# Patient Record
Sex: Female | Born: 1955 | Race: Asian | Hispanic: No | Marital: Married | Smoking: Never smoker
Health system: Southern US, Community
[De-identification: ages and names within clinical notes are randomized; demographics above are authoritative.]

## PROBLEM LIST (undated history)

## (undated) DIAGNOSIS — B1910 Unspecified viral hepatitis B without hepatic coma: Secondary | ICD-10-CM

## (undated) DIAGNOSIS — I10 Essential (primary) hypertension: Secondary | ICD-10-CM

---

## 2005-12-04 ENCOUNTER — Encounter: Admission: RE | Admit: 2005-12-04 | Discharge: 2005-12-04 | Payer: Self-pay | Admitting: General Practice

## 2006-02-24 ENCOUNTER — Ambulatory Visit: Payer: Self-pay | Admitting: Family Medicine

## 2006-03-04 ENCOUNTER — Ambulatory Visit: Payer: Self-pay | Admitting: Family Medicine

## 2006-04-28 ENCOUNTER — Ambulatory Visit (HOSPITAL_COMMUNITY): Admission: RE | Admit: 2006-04-28 | Discharge: 2006-04-28 | Payer: Self-pay | Admitting: Obstetrics

## 2006-09-11 ENCOUNTER — Emergency Department (HOSPITAL_COMMUNITY): Admission: EM | Admit: 2006-09-11 | Discharge: 2006-09-11 | Payer: Self-pay | Admitting: Family Medicine

## 2006-12-02 ENCOUNTER — Encounter: Admission: RE | Admit: 2006-12-02 | Discharge: 2006-12-02 | Payer: Self-pay | Admitting: General Practice

## 2008-07-27 ENCOUNTER — Emergency Department (HOSPITAL_COMMUNITY): Admission: EM | Admit: 2008-07-27 | Discharge: 2008-07-27 | Payer: Self-pay | Admitting: Emergency Medicine

## 2009-10-01 ENCOUNTER — Ambulatory Visit: Payer: Self-pay | Admitting: Internal Medicine

## 2009-10-01 ENCOUNTER — Encounter (INDEPENDENT_AMBULATORY_CARE_PROVIDER_SITE_OTHER): Payer: Self-pay | Admitting: Family Medicine

## 2009-10-01 LAB — CONVERTED CEMR LAB
ALT: 8 units/L (ref 0–35)
AST: 19 units/L (ref 0–37)
Alkaline Phosphatase: 97 units/L (ref 39–117)
Basophils Relative: 0 % (ref 0–1)
Calcium: 10.1 mg/dL (ref 8.4–10.5)
Chloride: 105 meq/L (ref 96–112)
Creatinine, Ser: 0.5 mg/dL (ref 0.40–1.20)
Lymphs Abs: 3 10*3/uL (ref 0.7–4.0)
Monocytes Relative: 8 % (ref 3–12)
Neutro Abs: 3.2 10*3/uL (ref 1.7–7.7)
Neutrophils Relative %: 46 % (ref 43–77)
RBC: 4.24 M/uL (ref 3.87–5.11)
Sed Rate: 35 mm/hr — ABNORMAL HIGH (ref 0–22)
WBC: 6.9 10*3/uL (ref 4.0–10.5)

## 2009-10-02 ENCOUNTER — Encounter (INDEPENDENT_AMBULATORY_CARE_PROVIDER_SITE_OTHER): Payer: Self-pay | Admitting: Family Medicine

## 2009-10-02 LAB — CONVERTED CEMR LAB: Hgb A1c MFr Bld: 6 % — ABNORMAL HIGH (ref <5.7)

## 2009-11-02 ENCOUNTER — Ambulatory Visit: Payer: Self-pay | Admitting: Internal Medicine

## 2009-11-02 ENCOUNTER — Encounter (INDEPENDENT_AMBULATORY_CARE_PROVIDER_SITE_OTHER): Payer: Self-pay | Admitting: Family Medicine

## 2009-11-02 LAB — CONVERTED CEMR LAB
CRP: 0 mg/dL (ref <0.6)
HCV Ab: NEGATIVE
Hep A Total Ab: POSITIVE — AB
Hep B Core Total Ab: POSITIVE — AB
Hep B S Ab: NEGATIVE

## 2009-11-03 ENCOUNTER — Encounter (INDEPENDENT_AMBULATORY_CARE_PROVIDER_SITE_OTHER): Payer: Self-pay | Admitting: Family Medicine

## 2009-11-08 ENCOUNTER — Encounter: Admission: RE | Admit: 2009-11-08 | Discharge: 2009-12-05 | Payer: Self-pay | Admitting: Family Medicine

## 2009-11-12 ENCOUNTER — Ambulatory Visit: Payer: Self-pay | Admitting: Family Medicine

## 2009-12-06 ENCOUNTER — Ambulatory Visit: Payer: Self-pay | Admitting: Internal Medicine

## 2009-12-06 ENCOUNTER — Ambulatory Visit (HOSPITAL_COMMUNITY): Admission: RE | Admit: 2009-12-06 | Discharge: 2009-12-06 | Payer: Self-pay | Admitting: Family Medicine

## 2010-01-25 ENCOUNTER — Encounter (INDEPENDENT_AMBULATORY_CARE_PROVIDER_SITE_OTHER): Payer: Self-pay | Admitting: *Deleted

## 2010-01-25 LAB — CONVERTED CEMR LAB
BUN: 11 mg/dL (ref 6–23)
Basophils Relative: 0 % (ref 0–1)
CO2: 26 meq/L (ref 19–32)
Calcium: 9.4 mg/dL (ref 8.4–10.5)
Eosinophils Absolute: 0.2 10*3/uL (ref 0.0–0.7)
Eosinophils Relative: 3 % (ref 0–5)
Glucose, Bld: 114 mg/dL — ABNORMAL HIGH (ref 70–99)
HCT: 38.8 % (ref 36.0–46.0)
Hemoglobin: 13.1 g/dL (ref 12.0–15.0)
Lymphs Abs: 2.7 10*3/uL (ref 0.7–4.0)
MCHC: 33.8 g/dL (ref 30.0–36.0)
MCV: 93.3 fL (ref 78.0–100.0)
Monocytes Absolute: 0.4 10*3/uL (ref 0.1–1.0)
Monocytes Relative: 7 % (ref 3–12)
RBC: 4.16 M/uL (ref 3.87–5.11)
TSH: 1.146 microintl units/mL (ref 0.350–4.500)

## 2010-01-31 ENCOUNTER — Ambulatory Visit (HOSPITAL_COMMUNITY): Admission: RE | Admit: 2010-01-31 | Discharge: 2010-01-31 | Payer: Self-pay | Admitting: Internal Medicine

## 2010-02-21 ENCOUNTER — Ambulatory Visit: Payer: Self-pay | Admitting: Gastroenterology

## 2010-04-25 ENCOUNTER — Ambulatory Visit: Admit: 2010-04-25 | Payer: Self-pay | Admitting: Gastroenterology

## 2010-05-04 ENCOUNTER — Encounter: Payer: Self-pay | Admitting: Internal Medicine

## 2010-05-04 ENCOUNTER — Encounter: Payer: Self-pay | Admitting: Gastroenterology

## 2010-07-24 LAB — DIFFERENTIAL
Lymphocytes Relative: 43 % (ref 12–46)
Lymphs Abs: 3.6 10*3/uL (ref 0.7–4.0)
Monocytes Relative: 7 % (ref 3–12)
Neutro Abs: 4.1 10*3/uL (ref 1.7–7.7)
Neutrophils Relative %: 49 % (ref 43–77)

## 2010-07-24 LAB — CBC
Hemoglobin: 13.3 g/dL (ref 12.0–15.0)
MCHC: 34.6 g/dL (ref 30.0–36.0)
MCV: 97 fL (ref 78.0–100.0)
RBC: 3.95 MIL/uL (ref 3.87–5.11)
RDW: 12.4 % (ref 11.5–15.5)

## 2010-07-24 LAB — COMPREHENSIVE METABOLIC PANEL
ALT: 14 U/L (ref 0–35)
CO2: 27 mEq/L (ref 19–32)
Calcium: 9.3 mg/dL (ref 8.4–10.5)
Creatinine, Ser: 0.5 mg/dL (ref 0.4–1.2)
GFR calc Af Amer: >60 mL/min (ref >60)
GFR calc non Af Amer: >60 mL/min (ref >60)
Glucose, Bld: 94 mg/dL (ref 70–99)
Sodium: 143 mEq/L (ref 135–145)
Total Protein: 7.5 g/dL (ref 6.0–8.3)

## 2010-07-24 LAB — URINALYSIS, ROUTINE W REFLEX MICROSCOPIC
Bilirubin Urine: NEGATIVE
Hgb urine dipstick: NEGATIVE
Ketones, ur: NEGATIVE mg/dL
Nitrite: NEGATIVE
Urobilinogen, UA: 1 mg/dL (ref 0.0–1.0)

## 2010-07-24 LAB — POCT CARDIAC MARKERS
CKMB, poc: <1 ng/mL — ABNORMAL LOW (ref 1.0–8.0)
Myoglobin, poc: 12.4 ng/mL (ref 12–200)
Troponin i, poc: <0.05 ng/mL (ref 0.00–0.09)
Troponin i, poc: <0.05 ng/mL (ref 0.00–0.09)

## 2010-07-24 LAB — LIPASE, BLOOD: Lipase: 37 U/L (ref 11–59)

## 2013-04-16 ENCOUNTER — Emergency Department (HOSPITAL_COMMUNITY)
Admission: EM | Admit: 2013-04-16 | Discharge: 2013-04-16 | Disposition: A | Discharge status: Home / Self Care | Payer: No Typology Code available for payment source | Attending: Emergency Medicine | Admitting: Emergency Medicine

## 2013-04-16 ENCOUNTER — Encounter (HOSPITAL_COMMUNITY): Payer: Self-pay | Admitting: Emergency Medicine

## 2013-04-16 DIAGNOSIS — R141 Gas pain: Secondary | ICD-10-CM | POA: Insufficient documentation

## 2013-04-16 DIAGNOSIS — I10 Essential (primary) hypertension: Secondary | ICD-10-CM | POA: Insufficient documentation

## 2013-04-16 DIAGNOSIS — R143 Flatulence: Secondary | ICD-10-CM

## 2013-04-16 DIAGNOSIS — M6281 Muscle weakness (generalized): Secondary | ICD-10-CM | POA: Insufficient documentation

## 2013-04-16 DIAGNOSIS — R5381 Other malaise: Secondary | ICD-10-CM | POA: Insufficient documentation

## 2013-04-16 DIAGNOSIS — R5383 Other fatigue: Secondary | ICD-10-CM

## 2013-04-16 DIAGNOSIS — R142 Eructation: Secondary | ICD-10-CM | POA: Insufficient documentation

## 2013-04-16 DIAGNOSIS — R197 Diarrhea, unspecified: Secondary | ICD-10-CM | POA: Insufficient documentation

## 2013-04-16 DIAGNOSIS — Z79899 Other long term (current) drug therapy: Secondary | ICD-10-CM | POA: Insufficient documentation

## 2013-04-16 DIAGNOSIS — R109 Unspecified abdominal pain: Secondary | ICD-10-CM | POA: Insufficient documentation

## 2013-04-16 DIAGNOSIS — K5900 Constipation, unspecified: Secondary | ICD-10-CM | POA: Insufficient documentation

## 2013-04-16 DIAGNOSIS — B1910 Unspecified viral hepatitis B without hepatic coma: Secondary | ICD-10-CM | POA: Insufficient documentation

## 2013-04-16 DIAGNOSIS — R630 Anorexia: Secondary | ICD-10-CM | POA: Insufficient documentation

## 2013-04-16 DIAGNOSIS — R634 Abnormal weight loss: Secondary | ICD-10-CM | POA: Insufficient documentation

## 2013-04-16 HISTORY — DX: Unspecified viral hepatitis B without hepatic coma: B19.10

## 2013-04-16 HISTORY — DX: Essential (primary) hypertension: I10

## 2013-04-16 LAB — CBC WITH DIFFERENTIAL/PLATELET
BASOS ABS: 0 10*3/uL (ref 0.0–0.1)
Basophils Relative: 0 % (ref 0–1)
EOS ABS: 0.1 10*3/uL (ref 0.0–0.7)
Eosinophils Relative: 1 % (ref 0–5)
HCT: 39.1 % (ref 36.0–46.0)
Hemoglobin: 13.4 g/dL (ref 12.0–15.0)
LYMPHS ABS: 2.6 10*3/uL (ref 0.7–4.0)
LYMPHS PCT: 37 % (ref 12–46)
MCH: 32.6 pg (ref 26.0–34.0)
MCHC: 34.3 g/dL (ref 30.0–36.0)
MCV: 95.1 fL (ref 78.0–100.0)
Monocytes Absolute: 0.6 10*3/uL (ref 0.1–1.0)
Monocytes Relative: 8 % (ref 3–12)
NEUTROS PCT: 55 % (ref 43–77)
Neutro Abs: 3.9 10*3/uL (ref 1.7–7.7)
PLATELETS: 221 10*3/uL (ref 150–400)
RBC: 4.11 MIL/uL (ref 3.87–5.11)
RDW: 12.6 % (ref 11.5–15.5)
WBC: 7.2 10*3/uL (ref 4.0–10.5)

## 2013-04-16 LAB — URINALYSIS, ROUTINE W REFLEX MICROSCOPIC
Bilirubin Urine: NEGATIVE
Glucose, UA: NEGATIVE mg/dL
Ketones, ur: NEGATIVE mg/dL
Leukocytes, UA: NEGATIVE
NITRITE: NEGATIVE
Protein, ur: NEGATIVE mg/dL
Specific Gravity, Urine: 1.01 (ref 1.005–1.030)
UROBILINOGEN UA: 0.2 mg/dL (ref 0.0–1.0)
pH: 7 (ref 5.0–8.0)

## 2013-04-16 LAB — COMPREHENSIVE METABOLIC PANEL
ALK PHOS: 103 U/L (ref 39–117)
AST: 11 U/L (ref 0–37)
Albumin: 4.6 g/dL (ref 3.5–5.2)
BUN: 14 mg/dL (ref 6–23)
CO2: 27 meq/L (ref 19–32)
Calcium: 9.5 mg/dL (ref 8.4–10.5)
Chloride: 102 mEq/L (ref 96–112)
Creatinine, Ser: 0.48 mg/dL — ABNORMAL LOW (ref 0.50–1.10)
GFR calc non Af Amer: >90 mL/min (ref >90)
GLUCOSE: 117 mg/dL — AB (ref 70–99)
POTASSIUM: 4.1 meq/L (ref 3.7–5.3)
SODIUM: 141 meq/L (ref 137–147)
TOTAL PROTEIN: 8.4 g/dL — AB (ref 6.0–8.3)
Total Bilirubin: 0.5 mg/dL (ref 0.3–1.2)

## 2013-04-16 LAB — LIPASE, BLOOD: LIPASE: 66 U/L — AB (ref 11–59)

## 2013-04-16 LAB — URINE MICROSCOPIC-ADD ON

## 2013-04-16 MED ORDER — DICYCLOMINE HCL 20 MG PO TABS: 20.0000 mg | ORAL_TABLET | Freq: Four times a day (QID) | ORAL | Status: DC | PRN

## 2013-04-16 MED ORDER — FAMOTIDINE 20 MG PO TABS: 20.0000 mg | ORAL_TABLET | Freq: Two times a day (BID) | ORAL | Status: DC

## 2013-04-16 NOTE — ED Notes (Signed)
Pt.'s daughter reported elevated blood pressure at home this evening 160/103 with fatigue and slight headache . Pt. is not taking medications for hypertension .

## 2013-04-16 NOTE — Discharge Instructions (Signed)
Your workup today has not shown a specific cause for your symptoms.  Please take medications as prescribed.  Check your blood pressures twice a day and write the numbers down in a log.  Bring this with ou to your first appointment.  Follow up with the referrals listed above for further workup.  Return to the ER for worsening condition or new concerning symptoms.   Abdominal Pain Abdominal pain can be caused by many things. Your caregiver decides the seriousness of your pain by an examination and possibly blood tests and X-rays. Many cases can be observed and treated at home. Most abdominal pain is not caused by a disease and will probably improve without treatment. However, in many cases, more time must pass before a clear cause of the pain can be found. Before that point, it may not be known if you need more testing, or if hospitalization or surgery is needed. HOME CARE INSTRUCTIONS   Do not take laxatives unless directed by your caregiver.  Take pain medicine only as directed by your caregiver.  Only take over-the-counter or prescription medicines for pain, discomfort, or fever as directed by your caregiver.  Try a clear liquid diet (broth, tea, or water) for as long as directed by your caregiver. Slowly move to a bland diet as tolerated. SEEK IMMEDIATE MEDICAL CARE IF:   The pain does not go away.  You have a fever.  You keep throwing up (vomiting).  The pain is felt only in portions of the abdomen. Pain in the right side could possibly be appendicitis. In an adult, pain in the left lower portion of the abdomen could be colitis or diverticulitis.  You pass bloody or black tarry stools. MAKE SURE YOU:   Understand these instructions.  Will watch your condition.  Will get help right away if you are not doing well or get worse. Document Released: 01/08/2005 Document Revised: 06/23/2011 Document Reviewed: 11/17/2007 [facility] Patient Information 2014 [place].  How to Take  Your Blood Pressure  These instructions are only for electronic home blood pressure machines. You will need:   An automatic or semi-automatic blood pressure machine.  Fresh batteries for the blood pressure machine. HOW DO I USE THESE TOOLS TO CHECK MY BLOOD PRESSURE?   There are 2 numbers that make up your blood pressure. For example: 120/80.  The first number (120 in our example) is called the "systolic pressure." It is a measure of the pressure in your blood vessels when your heart is pumping blood.  The second number (80 in our example) is called the "diastolic pressure." It is a measure of the pressure in your blood vessels when your heart is resting between beats.  Before you buy a home blood pressure machine, check the size of your arm so you can buy the right size cuff. Here is how to check the size of your arm:  Use a tape measure that shows both inches and centimeters.  Wrap the tape measure around the middle upper part of your arm. You may need someone to help you measure right.  Write down your arm measurement in both inches and centimeters.  To measure your blood pressure right, it is important to have the right size cuff.  If your arm is up to 13 inches (37 to 34 centimeters), get an adult cuff size.  If your arm is 13 to 17 inches (35 to 44 centimeters), get a large adult cuff size.  If your arm is 17 to 20  inches (45 to 52 centimeters), get an adult thigh cuff.  Try to rest or relax for at least 30 minutes before you check your blood pressure.  Do not smoke.  Do not have any drinks with caffeine, such as:  Pop.  Coffee.  Tea.  Check your blood pressure in a quiet room.  Sit down and stretch out your arm on a table. Keep your arm at about the level of your heart. Let your arm relax. GETTING BLOOD PRESSURE READINGS  Make sure you remove any tight-fighting clothing from your arm. Wrap the cuff around your upper arm. Wrap it just above the bend, and above  where you felt the pulse. You should be able to slip a finger between the cuff and your arm. If you cannot slip a finger in the cuff, it is too tight and should be removed and rewrapped.  Some units requires you to manually pump up the arm cuff.  Automatic units inflate the cuff when you press a button.  Cuff deflation is automatic in both models.  After the cuff is inflated, the unit measures your blood pressure and pulse. The readings are displayed on a monitor. Hold still and breathe normally while the cuff is inflated.  Getting a reading takes less than a minute.  Some models store readings in a memory. Some provide a printout of readings.  Get readings at different times of the day. You should wait at least 5 minutes between readings. Take readings with you to your next doctor's visit. Document Released: 03/13/2008 Document Revised: 06/23/2011 Document Reviewed: 03/13/2008 [facility] Patient Information 2014 [place].

## 2013-04-16 NOTE — ED Provider Notes (Signed)
CSN: [id number]     Arrival date & time 04/16/13  0130 History   First MD Initiated Contact with Patient 04/16/13 0315     Chief Complaint  Patient presents with  . Hypertension   (Consider location/radiation/quality/duration/timing/severity/associated sxs/prior Treatment) HPI 58 year old female presents to emergency department with her daughter with complaints of 2 weeks of abdominal pain, generalized weakness, and fatigue, loss of weight, and abdominal bloating.  Patient has had ongoing stomach issues for many years with bloating and pain.  Over the last 2 weeks.  Pain has been more constant.  It is worse after eating.  It is described as an aching, cramping, uncomfortable feeling.  Patient reports a 14 pound weight loss over the last 3 months.  Patient has lost her appetite.  She is concerned about possible stomach cancer as her father died from it.  A few years ago.  She has no primary care Dr.  Regis Bill notes that her blood pressure is elevated.  When she is having episodes of pain.  No prior history of hypertension.  Patient denies any change in the color or caliber of her stools.  She has intermittent constipation and diarrhea at times.  No fevers or chills.  She will occasionally have headaches.  Past Medical History  Diagnosis Date  . Hypertension   . Hepatitis B    History reviewed. No pertinent past surgical history. No family history on file. History  Substance Use Topics  . Smoking status: Never Smoker   . Smokeless tobacco: Not on file  . Alcohol Use: No   OB History   Grav Para Term Preterm Abortions TAB SAB Ect Mult Living                 Review of Systems  All other systems reviewed and are negative.    Allergies  Review of patient's allergies indicates no known allergies.  Home Medications   Current Outpatient Rx  Name  Route  Sig  Dispense  Refill  . acetaminophen (TYLENOL) 325 MG tablet   Oral   Take 650 mg by mouth every 6 (six) hours as needed for mild  pain.         Marland Kitchen dicyclomine (BENTYL) 20 MG tablet   Oral   Take 1 tablet (20 mg total) by mouth every 6 (six) hours as needed for spasms (for abdominal cramping).   20 tablet   0   . famotidine (PEPCID) 20 MG tablet   Oral   Take 1 tablet (20 mg total) by mouth 2 (two) times daily.   30 tablet   0    BP 137/75  Pulse 78  Temp(Src) 98.2 F (36.8 C) (Oral)  Resp 9  Wt 129 lb 2 oz (58.571 kg)  SpO2 100% Physical Exam  Nursing note and vitals reviewed. Constitutional: She is oriented to person, place, and time. She appears well-developed and well-nourished. No distress.  HENT:  Head: Normocephalic and atraumatic.  Right Ear: External ear normal.  Left Ear: External ear normal.  Nose: Nose normal.  Mouth/Throat: Oropharynx is clear and moist.  Eyes: Conjunctivae and EOM are normal. Pupils are equal, round, and reactive to light.  Neck: Normal range of motion. Neck supple. No JVD present. No tracheal deviation present. No thyromegaly present.  Cardiovascular: Normal rate, regular rhythm, normal heart sounds and intact distal pulses.  Exam reveals no gallop and no friction rub.   No murmur heard. Pulmonary/Chest: Effort normal and breath sounds normal. No stridor. No respiratory  distress. She has no wheezes. She has no rales. She exhibits no tenderness.  Abdominal: Soft. Bowel sounds are normal. She exhibits no distension and no mass. There is no tenderness. There is no rebound and no guarding.  Musculoskeletal: Normal range of motion. She exhibits no edema and no tenderness.  Lymphadenopathy:    She has no cervical adenopathy.  Neurological: She is alert and oriented to person, place, and time. She has normal reflexes. No cranial nerve deficit. She exhibits normal muscle tone. Coordination normal.  Skin: Skin is warm and dry. No rash noted. No erythema. No pallor.  Psychiatric: She has a normal mood and affect. Her behavior is normal. Judgment and thought content normal.     ED Course  Procedures (including critical care time) Labs Review Labs Reviewed  COMPREHENSIVE METABOLIC PANEL - Abnormal; Notable for the following:    Glucose, Bld 117 (*)    Creatinine, Ser 0.48 (*)    Total Protein 8.4 (*)    All other components within normal limits  LIPASE, BLOOD - Abnormal; Notable for the following:    Lipase 66 (*)    All other components within normal limits  URINALYSIS, ROUTINE W REFLEX MICROSCOPIC - Abnormal; Notable for the following:    Hgb urine dipstick SMALL (*)    All other components within normal limits  CBC WITH DIFFERENTIAL  URINE MICROSCOPIC-ADD ON   Imaging Review No results found.  EKG Interpretation    Date/Time:  Saturday April 16 2013 03:19:25 EST Ventricular Rate:  74 PR Interval:  157 QRS Duration: 90 QT Interval:  411 QTC Calculation: 456 R Axis:   44 Text Interpretation:  Sinus rhythm Baseline wander in lead(s) V6 No old tracing to compare Confirmed by Kalei Meda  MD, Laurrie Toppin ([id number]) on 04/16/2013 4:08:10 AM            MDM   1. Abdominal pain   2. Hypertension    58 year old female with ongoing abdominal pain, generalized weakness, and reported weight loss.  Her exam today is benign.  Labwork unremarkable.  She does have slightly elevated blood pressure.  Plan is to give trial of Bentyl and Pepcid, referral to GI, and local primary care Dr.    Kalman Drape, MD 04/16/13 [phone]

## 2013-04-27 ENCOUNTER — Encounter: Payer: Self-pay | Admitting: Gastroenterology

## 2013-05-09 ENCOUNTER — Ambulatory Visit: Payer: Self-pay | Admitting: Family Medicine

## 2013-05-27 ENCOUNTER — Ambulatory Visit: Payer: Self-pay | Admitting: Emergency Medicine

## 2013-05-27 VITALS — BP 142/92 | HR 81 | Temp 98.9°F | Resp 16 | Ht 61.5 in | Wt 123.6 lb

## 2013-05-27 DIAGNOSIS — F3289 Other specified depressive episodes: Secondary | ICD-10-CM

## 2013-05-27 DIAGNOSIS — R5383 Other fatigue: Secondary | ICD-10-CM

## 2013-05-27 DIAGNOSIS — R05 Cough: Secondary | ICD-10-CM

## 2013-05-27 DIAGNOSIS — M419 Scoliosis, unspecified: Secondary | ICD-10-CM

## 2013-05-27 DIAGNOSIS — F32A Depression, unspecified: Secondary | ICD-10-CM

## 2013-05-27 DIAGNOSIS — F329 Major depressive disorder, single episode, unspecified: Secondary | ICD-10-CM

## 2013-05-27 DIAGNOSIS — M4120 Other idiopathic scoliosis, site unspecified: Secondary | ICD-10-CM

## 2013-05-27 DIAGNOSIS — R059 Cough, unspecified: Secondary | ICD-10-CM

## 2013-05-27 DIAGNOSIS — R634 Abnormal weight loss: Secondary | ICD-10-CM

## 2013-05-27 DIAGNOSIS — R5381 Other malaise: Secondary | ICD-10-CM

## 2013-05-27 DIAGNOSIS — R109 Unspecified abdominal pain: Secondary | ICD-10-CM

## 2013-05-27 LAB — POCT GLYCOSYLATED HEMOGLOBIN (HGB A1C): Hemoglobin A1C: 5.5

## 2013-05-27 MED ORDER — FLUOXETINE HCL 20 MG PO TABS: 20.0000 mg | ORAL_TABLET | Freq: Every day | ORAL | Status: DC

## 2013-05-27 MED ORDER — MELOXICAM 15 MG PO TABS: 15.0000 mg | ORAL_TABLET | Freq: Every day | ORAL | Status: DC

## 2013-05-27 NOTE — Progress Notes (Addendum)
[facility] [address], [place] [address] [phone]  Date:  05/27/2013   Name:  Lynn Fleming   DOB:  1955-04-27   MRN:  [id number]  PCP:  No PCP Per Patient    Chief Complaint: Back Pain, Abdominal Pain, Fatigue and Generalized Body Aches   History of Present Illness:  Lynn Fleming is a 58 y.o. very pleasant female patient who presents with the following:  Multiple issues.  Was seen in ER with abdominal pain for a couple months associated with fatigue and weight loss. There was an expressed concern by her daughter she was concerned about having stomach cancer as there is a family history.  She was referred to a GI doc but failed to attend.  Her labs at that time were essentially normal.  Her companion that brought her today feels that she has a "mental problem."  She is complaining of extremity and back pain with no history of injury or overuse. Says back pain present for 8 years at least and nothing ever done for it.   No nausea or vomiting.  No melena.  The patient has no complaint of blood, mucous, or pus in her stools. Appetite is normal and is sleeping normally.  No GU or GYN symptoms.   Not working.  No insurance.  Fearful that she must stay home alone all day and is worried about her life, finances and husband that is not working full time.  She has five adult children, two of whom are in [place]  She describes her life as happy while visiting [place] but sorrowful in the [place].  Has been out of work 6 months.  Patient had a job but quit after one day due numbness and weakness in hands and back pain.   There are no active problems to display for this patient.   Past Medical History  Diagnosis Date  . Hypertension   . Hepatitis B     History reviewed. No pertinent past surgical history.  History  Substance Use Topics  . Smoking status: Never Smoker   . Smokeless tobacco: Not on file  . Alcohol Use: No    No family history on  file.  No Known Allergies  Medication list has been reviewed and updated.  Current Outpatient Prescriptions on File Prior to Visit  Medication Sig Dispense Refill  . acetaminophen (TYLENOL) 325 MG tablet Take 650 mg by mouth every 6 (six) hours as needed for mild pain.      Marland Kitchen dicyclomine (BENTYL) 20 MG tablet Take 1 tablet (20 mg total) by mouth every 6 (six) hours as needed for spasms (for abdominal cramping).  20 tablet  0  . famotidine (PEPCID) 20 MG tablet Take 1 tablet (20 mg total) by mouth 2 (two) times daily.  30 tablet  0   No current facility-administered medications on file prior to visit.    Review of Systems:  As per HPI, otherwise negative.    Physical Examination: Filed Vitals:   05/27/13 1630  BP: 142/92  Pulse: 81  Temp: 98.9 F (37.2 C)  Resp: 16   Filed Vitals:   05/27/13 1630  Height: 5' 1.5" (1.562 m)  Weight: 123 lb 9.6 oz (56.065 kg)   Body mass index is 22.98 kg/(m^2). Ideal Body Weight: Weight in (lb) to have BMI = 25: 134.2  GEN: WDWN, NAD, Non-toxic, A & O x 3 HEENT: Atraumatic, Normocephalic. Neck supple. No masses,  No LAD. Ears and Nose: No external deformity. CV: RRR, No M/G/R. No JVD. No thrill. No extra heart sounds. PULM: CTA B, no wheezes, crackles, rhonchi. No retractions. No resp. distress. No accessory muscle use. ABD: S, NT, ND, +BS. No rebound. No HSM. EXTR: No c/c/e NEURO Normal gait.  PSYCH: Normally interactive. Conversant. Not anxious appearing.  Calm demeanor but tearful.  Back:  Marked scoliosis   Assessment and Plan: Scoliosis Back pain Abdominal pain with weight loss by history Depression   Signed,  Ellison Carwin, MD   Results for orders placed in visit on 05/27/13  POCT GLYCOSYLATED HEMOGLOBIN (HGB A1C)      Result Value Ref Range   Hemoglobin A1C 5.5

## 2013-05-27 NOTE — Patient Instructions (Signed)
Scoliosis  Scoliosis is the name given to a spine that curves sideways.Scoliosis can cause twisting of your shoulders, hips, chest, back, and rib cage.   CAUSES   The cause of scoliosis is not always known. It may be caused by a birth defect or by a disease that can cause muscular dysfunction and imbalance, such as cerebral palsy and muscular dystrophy.   RISK FACTORS  Having a disease that causes muscle disease or dysfunction.  SIGNS AND SYMPTOMS  Scoliosis often has no signs or symptoms.If they are present, they may include:   Unequal size of one body side compared to the other (asymmetry).   Visible curvature of the spine.   Pain. The pain may limit physical activity.   Shortness of breath.   Bowel or bladder issues.  DIAGNOSIS  A skilled health care provider will perform an evaluation. This will involve:   Taking your history.   Performing a physical examination.   Performing neurological exam to detect nerve or muscle function loss.   Range of motion studies on the spine.   X-rays.  An MRI may also be obtained.  TREATMENT   Treatment varies depending on the nature, extent, and severity of the disease. If the curvature is not great, you may need only observation. A brace may be used to prevent scoliosis from progressing. A brace may also be needed during growth spurts. Physical therapy may be of benefit. Surgery may be required.   HOME CARE INSTRUCTIONS    Your health care provider may suggest exercises to strengthen your muscles. Perform them as directed.   Ask your health care provider before participating in any sports.    If you have been prescribed an orthopedic brace, wear it as instructed by your health care provider.  SEEK MEDICAL CARE IF:  Your brace causes the skin to become sore (chafe) or is uncomfortable.   SEEK IMMEDIATE MEDICAL CARE IF:   You have back pain that is not relieved by the medicines prescribed by your health care provider.    Your legs feel weak or you lose function  in your legs.   You lose some bowel or bladder control.   Document Released: 03/28/2000 Document Revised: 01/19/2013 Document Reviewed: 12/06/2012  [employer] Patient Information 2014 [employer].

## 2013-05-31 ENCOUNTER — Ambulatory Visit: Payer: Self-pay | Admitting: Emergency Medicine

## 2013-05-31 VITALS — BP 158/92 | HR 85 | Temp 98.8°F | Resp 16 | Ht 61.5 in | Wt 122.0 lb

## 2013-05-31 DIAGNOSIS — F329 Major depressive disorder, single episode, unspecified: Secondary | ICD-10-CM

## 2013-05-31 DIAGNOSIS — M4120 Other idiopathic scoliosis, site unspecified: Secondary | ICD-10-CM

## 2013-05-31 DIAGNOSIS — F3289 Other specified depressive episodes: Secondary | ICD-10-CM

## 2013-05-31 DIAGNOSIS — K2990 Gastroduodenitis, unspecified, without bleeding: Secondary | ICD-10-CM

## 2013-05-31 DIAGNOSIS — F32A Depression, unspecified: Secondary | ICD-10-CM

## 2013-05-31 DIAGNOSIS — R079 Chest pain, unspecified: Secondary | ICD-10-CM

## 2013-05-31 DIAGNOSIS — K2970 Gastritis, unspecified, without bleeding: Secondary | ICD-10-CM

## 2013-05-31 DIAGNOSIS — M419 Scoliosis, unspecified: Secondary | ICD-10-CM

## 2013-05-31 NOTE — Progress Notes (Signed)
[facility] [address] [place] [address] [phone]  Date:  05/31/2013   Name:  Lynn Fleming   DOB:  [birth year]   MRN:  [id number]  PCP:  No PCP Per Patient    Chief Complaint: Chest Pain   History of Present Illness:  Lynn Fleming is a 58 y.o. very pleasant female patient who presents with the following:  Patient was seen Friday and put on paxil.  Says it isn't working and has stopped it.  She was put on pepcid and has stopped it because it made her worse.  She was previously scheduled for an EGD by Dr Benson Norway and did not complete it as she could not fast for the test.  She is complaining of pain in her abdomen that has been present for years.    There are no active problems to display for this patient.   Past Medical History  Diagnosis Date  . Hypertension   . Hepatitis B     History reviewed. No pertinent past surgical history.  History  Substance Use Topics  . Smoking status: Never Smoker   . Smokeless tobacco: Not on file  . Alcohol Use: No    History reviewed. No pertinent family history.  No Known Allergies  Medication list has been reviewed and updated.  Current Outpatient Prescriptions on File Prior to Visit  Medication Sig Dispense Refill  . acetaminophen (TYLENOL) 325 MG tablet Take 650 mg by mouth every 6 (six) hours as needed for mild pain.      . famotidine (PEPCID) 20 MG tablet Take 1 tablet (20 mg total) by mouth 2 (two) times daily.  30 tablet  0  . dicyclomine (BENTYL) 20 MG tablet Take 1 tablet (20 mg total) by mouth every 6 (six) hours as needed for spasms (for abdominal cramping).  20 tablet  0  . FLUoxetine (PROZAC) 20 MG tablet Take 1 tablet (20 mg total) by mouth daily.  30 tablet  3  . meloxicam (MOBIC) 15 MG tablet Take 1 tablet (15 mg total) by mouth daily.  30 tablet  2   No current facility-administered medications on file prior to visit.    Review of Systems:  As per HPI, otherwise negative.     Physical Examination: Filed Vitals:   05/31/13 1733  BP: 158/92  Pulse: 85  Temp: 98.8 F (37.1 C)  Resp: 16   Filed Vitals:   05/31/13 1733  Height: 5' 1.5" (1.562 m)  Weight: 122 lb (55.339 kg)   Body mass index is 22.68 kg/(m^2). Ideal Body Weight: Weight in (lb) to have BMI = 25: 134.2  GEN: WDWN, NAD, Non-toxic, A & O x 3 HEENT: Atraumatic, Normocephalic. Neck supple. No masses, No LAD. Ears and Nose: No external deformity. CV: RRR, No M/G/R. No JVD. No thrill. No extra heart sounds. PULM: CTA B, no wheezes, crackles, rhonchi. No retractions. No resp. distress. No accessory muscle use. ABD: S, NT, ND, +BS. No rebound. No HSM. EXTR: No c/c/e NEURO Normal gait.  PSYCH: Normally interactive. Conversant. Not depressed or anxious appearing.  Calm demeanor.    Assessment and Plan: Epigastric pain Follow up with Dr Benson Norway and ortho for scoliosis Non compliant. Instructed to take medication as instructed and follow up with consultants.   Signed,  Ellison Carwin, MD

## 2013-06-01 ENCOUNTER — Telehealth: Payer: Self-pay

## 2013-06-01 NOTE — Telephone Encounter (Signed)
Dr. Ouida Sills patient states you were suppose to send Rx to her pharmacy.  Please advise

## 2013-06-01 NOTE — Telephone Encounter (Signed)
She is incorrect. She needs to take the meds she has

## 2013-06-02 ENCOUNTER — Telehealth: Payer: Self-pay | Admitting: General Practice

## 2013-06-02 LAB — H. PYLORI ANTIBODY, IGG: H Pylori IgG: 0.45 {ISR}

## 2013-06-02 NOTE — Telephone Encounter (Signed)
Called and left vm for patient to give [place] a call back to schedule an appointment to see Dr Marin Comment

## 2013-06-03 ENCOUNTER — Encounter: Payer: Self-pay | Admitting: Emergency Medicine

## 2013-06-07 ENCOUNTER — Ambulatory Visit: Payer: Self-pay | Admitting: Family Medicine

## 2013-06-07 ENCOUNTER — Telehealth: Payer: Self-pay

## 2013-06-07 NOTE — Telephone Encounter (Signed)
PT HAD LAB WORK DONE AND THE DR WANTED HER TO COME IN TODAY AT 2:30, THEY WOULD LIKE TO KNOW WHY, HAD LAB WORK DONE AND WANT THE RESULTS. [place] [phone]

## 2013-06-08 NOTE — Telephone Encounter (Signed)
Pt currently not excepting phone calls. Will try again later.

## 2013-06-12 NOTE — Telephone Encounter (Signed)
Pt notified of results

## 2013-06-17 NOTE — Telephone Encounter (Signed)
Please call patient, and check status

## 2013-06-17 NOTE — Telephone Encounter (Signed)
LM with a female for pt to [facility]

## 2013-06-27 ENCOUNTER — Emergency Department (HOSPITAL_COMMUNITY): Payer: No Typology Code available for payment source

## 2013-06-27 ENCOUNTER — Encounter (HOSPITAL_COMMUNITY): Payer: Self-pay | Admitting: Emergency Medicine

## 2013-06-27 DIAGNOSIS — R072 Precordial pain: Secondary | ICD-10-CM | POA: Insufficient documentation

## 2013-06-27 DIAGNOSIS — Z79899 Other long term (current) drug therapy: Secondary | ICD-10-CM | POA: Insufficient documentation

## 2013-06-27 DIAGNOSIS — I10 Essential (primary) hypertension: Secondary | ICD-10-CM | POA: Insufficient documentation

## 2013-06-27 DIAGNOSIS — Z8619 Personal history of other infectious and parasitic diseases: Secondary | ICD-10-CM | POA: Insufficient documentation

## 2013-06-27 DIAGNOSIS — Z791 Long term (current) use of non-steroidal anti-inflammatories (NSAID): Secondary | ICD-10-CM | POA: Insufficient documentation

## 2013-06-27 LAB — BASIC METABOLIC PANEL
BUN: 11 mg/dL (ref 6–23)
CALCIUM: 9.1 mg/dL (ref 8.4–10.5)
CO2: 22 mEq/L (ref 19–32)
Chloride: 101 mEq/L (ref 96–112)
Creatinine, Ser: 0.43 mg/dL — ABNORMAL LOW (ref 0.50–1.10)
GFR calc Af Amer: >90 mL/min (ref >90)
GFR calc non Af Amer: >90 mL/min (ref >90)
Glucose, Bld: 135 mg/dL — ABNORMAL HIGH (ref 70–99)
Potassium: 3.4 mEq/L — ABNORMAL LOW (ref 3.7–5.3)
Sodium: 139 mEq/L (ref 137–147)

## 2013-06-27 LAB — CBC
HEMATOCRIT: 37.4 % (ref 36.0–46.0)
Hemoglobin: 13 g/dL (ref 12.0–15.0)
MCH: 32.7 pg (ref 26.0–34.0)
MCHC: 34.8 g/dL (ref 30.0–36.0)
MCV: 94.2 fL (ref 78.0–100.0)
Platelets: 248 10*3/uL (ref 150–400)
RBC: 3.97 MIL/uL (ref 3.87–5.11)
RDW: 13 % (ref 11.5–15.5)
WBC: 6.8 10*3/uL (ref 4.0–10.5)

## 2013-06-27 LAB — I-STAT TROPONIN, ED: Troponin i, poc: 0.01 ng/mL (ref 0.00–0.08)

## 2013-06-27 NOTE — ED Notes (Signed)
Patient with history of HTN, comes in with chest pain and shortness of breath that started approx 30 minutes ago.   Patient is diaphoretic upon arrival.  Patient is CAOx3.  Patient describes that pain as a pressure.

## 2013-06-28 ENCOUNTER — Emergency Department (HOSPITAL_COMMUNITY)
Admission: EM | Admit: 2013-06-28 | Discharge: 2013-06-28 | Disposition: A | Discharge status: Home / Self Care | Payer: No Typology Code available for payment source | Attending: Emergency Medicine | Admitting: Emergency Medicine

## 2013-06-28 DIAGNOSIS — R079 Chest pain, unspecified: Secondary | ICD-10-CM

## 2013-06-28 DIAGNOSIS — I10 Essential (primary) hypertension: Secondary | ICD-10-CM

## 2013-06-28 NOTE — Discharge Instructions (Signed)
Keep a record of your blood pressures and follow up these up with your primary doctor in the next week.  Return to the ER if your symptoms worsen or you develop other new or concerning symptoms.   T?ng Huy?t p (Hypertension) Khi tim ??p, n ??y mu qua cc ??ng m?ch. L?c ??y ny ???c g?i l huy?t p. N?u huy?t p qu cao, ng??i ta g?i ? l ch?ng t?ng huy?t p (HTN) hay cao huy?t p. Ch?ng t?ng huy?t p r?t nguy hi?m v b?n c th? m?c ph?i m khng hay bi?t g. Huy?t p cao c ngh?a l tim c?a b?n ph?i lm vi?c nhi?u h?n ?? b?m mu. Cc ??ng m?ch c th? b? h?p ho?c x? c?ng. T?ng cng cho tim lm b?n c nguy c? m?c b?nh tim, ??t qu?, v cc v?n ?? khc.  Huy?t p bao g?m hai con s?, s? cao h?n trn s? th?p h?n, v d?: 110/72. Huy?t p ???c ghi l "110 trn 72". L t??ng l d??i 120 cho s? trn (tm thu) v d??i 80 cho s? d??i (tm tr??ng). Ghi huy?t p c?a b?n ngy hm nay. B?n nn h?t s?c ch  ??n huy?t p c?a mnh n?u ?ang m?c ph?i nh?ng c?n b?nh nh?t ??ng nh? l:  Suy tim  Tr??c ?y b? nh?i mu c? tim  Ti?u ???ng  B?nh th?n m?n tnh  Tr??c ?y b? ??t qu?  Nhi?u nguy c? m?c b?nh tim. ?? xem c b? m?c ch?ng t?ng huy?t p hay khng, b?n nn ?o huy?t p khi ?ang ng?i v?i cnh tay ???c ??t ngang t?m tim c?a b?n. Nn ?o huy?t p t nh?t l hai l?n. ??c s? huy?t p cao m?t l?n (??c bi?t l ? [place] C?p C?u) khng c ngh?a l b?n c?n ph?i ???c ?i?u tr?. C th? c cc c?n b?nh c huy?t p khc nhau gi?a tay tri v tay ph?i. ?i?u quan tr?ng l ph?i ?i khm s?m ?? chuyn gia ch?m [place] s?c kh?e ki?m tra l?i. ?a s? m?i ng??i m?c ph?i ch?ng t?ng huy?t p nguyn pht, ?i?u ny c ngh?a l khng c nguyn nhn c? th?. C th? lm gi?m d?ng cao huy?t p ny b?ng cch thay ??i cc y?u t? v? l?i s?ng nh? l:  C?ng th?ng  Ht thu?c  Khng t?p th? d?c  Th?a cn  S? d?ng ma ty/thu?c l/r??u  Ch? ?? ?n t mu?i ?a s? ng??i ta khng c cc tri?u ch?ng do cao huy?t p cho ??n khi b?nh gy t?n h?i cho c?  th?. Vi?c ?i?u tr? hi?u qu? th??ng c th? ng?n ch?n, tr hon hay gi?m m?c ?? t?n h?i ?. ?I?U TR? Khi ? xc ??nh ???c nguyn nhn, ?i?u tr? t?ng huy?t p s? nh?m vo nguyn nhn. C r?t nhi?u thu?c ?i?u tr? ch?ng t?ng huy?t p. Cc thu?c ny c th? ???c chia ra thnh vi lo?i, v chuyn gia ch?m [place] [employer] s?c kh?e s? gip b?n ch?n cc lo?i thu?c t?t nh?t cho b?n. Thu?c c th? c cc tc d?ng ph?. B?n v chuyn gia ch?m [place] s?c kh?e nn xem l?i nh?ng tc d?ng ph? ?. N?u huy?t p v?n cao sau khi b?n ? thay ??i l?i s?ng ho?c b?t ??u dng thu?c th,  C th? c?n ph?i thay ??i (cc) thu?c.  C th? c?n ph?i gi?i quy?t ??n nh?ng v?n ?? khc.  Ph?i ch?c ch?n r?ng b?n hi?u toa thu?c, v bi?t r khi no dng thu?c v dng  thu?c nh? th? no.  Ph?i ch?c ch?n r?ng b?n g?p chuyn gia ch?m [place] s?c kh?e ?? ti khm trong khung th?i gian ? ???c khuy?n co (th??ng l trong vng hai tu?n) ?? ki?m tra l?i huy?t p v xem l?i thu?c.  N?u dng nhi?u lo?i thu?c ?? h? huy?t p, ph?i ch?c ch?n r?ng b?n bi?t khi no nn dng thu?c v dng nh? th? no. Dng cng m?t lc hai lo?i thu?c c th? d?n ??n huy?t p h? qu th?p. ?I KHM NGAY L?P T?C N?U:  B?n b? nh?c ??u d? d?i, th? l?c thay ??i hay b? m?, ho?c l l?n.  B?n b? y?u ho?c t b?t th??ng, ho?c c c?m gic ng?t.  ?au b?ng hay ng?c tr?m tr?ng, i m?a, ho?c kh th?. HY CH?C CH?N R?NG B?N:  Hi?u nh?ng ch? d?n ny.  S? theo di tnh tr?ng c?a b?n.  S? nh?n ???c s? gip ?? ngay l?p t?c n?u b?n khng ?? ho?c tnh tr?ng tr?m tr?ng h?n. Document Released: 03/31/2005 Document Revised: 12/01/2012 [facility] Patient Information 2014 [place], [place].

## 2013-06-29 NOTE — ED Provider Notes (Signed)
CSN: [id number]     Arrival date & time 06/27/13  2240 History   First MD Initiated Contact with Patient 06/28/13 0335     Chief Complaint  Patient presents with  . Chest Pain     (Consider location/radiation/quality/duration/timing/severity/associated sxs/prior Treatment) HPI Comments: Patient is a 58 year old female with history of hypertension. She presents with chest discomfort that started approximately 30 minutes prior to arrival. She denies any recent exertional symptoms and has no cardiac risk factors with the exception of hypertension.  Patient does not speak [name], only [place]. History was taken using the daughter as Optometrist. She was present at bedside.  Patient is a 58 y.o. female presenting with chest pain. The history is provided by the patient.  Chest Pain Pain location:  Substernal area Pain quality: pressure   Pain radiates to:  Does not radiate Pain radiates to the back: no   Pain severity:  Moderate Onset quality:  Gradual Duration:  1 hour Timing:  Constant Progression:  Resolved Relieved by:  Nothing Worsened by:  Nothing tried   Past Medical History  Diagnosis Date  . Hypertension   . Hepatitis B    History reviewed. No pertinent past surgical history. No family history on file. History  Substance Use Topics  . Smoking status: Never Smoker   . Smokeless tobacco: Not on file  . Alcohol Use: No   OB History   Grav Para Term Preterm Abortions TAB SAB Ect Mult Living                 Review of Systems  Cardiovascular: Positive for chest pain.  All other systems reviewed and are negative.      Allergies  Review of patient's allergies indicates no known allergies.  Home Medications   Current Outpatient Rx  Name  Route  Sig  Dispense  Refill  . hydrOXYzine (ATARAX/VISTARIL) 50 MG tablet   Oral   Take 25,050 mg by mouth at bedtime as needed (for sleep).         . meloxicam (MOBIC) 15 MG tablet   Oral   Take 1 tablet (15 mg  total) by mouth daily.   30 tablet   2   . pantoprazole (PROTONIX) 40 MG tablet   Oral   Take 40 mg by mouth daily.          BP 154/79  Pulse 66  Temp(Src) 98.4 F (36.9 C) (Oral)  Resp 16  SpO2 99% Physical Exam  Nursing note and vitals reviewed. Constitutional: She is oriented to person, place, and time. She appears well-developed and well-nourished. No distress.  HENT:  Head: Normocephalic and atraumatic.  Neck: Normal range of motion. Neck supple.  Cardiovascular: Normal rate and regular rhythm.  Exam reveals no gallop and no friction rub.   No murmur heard. Pulmonary/Chest: Effort normal and breath sounds normal. No respiratory distress. She has no wheezes.  Abdominal: Soft. Bowel sounds are normal. She exhibits no distension. There is no tenderness.  Musculoskeletal: Normal range of motion.  Neurological: She is alert and oriented to person, place, and time.  Skin: Skin is warm and dry. She is not diaphoretic.    ED Course  Procedures (including critical care time) Labs Review Labs Reviewed  BASIC METABOLIC PANEL - Abnormal; Notable for the following:    Potassium 3.4 (*)    Glucose, Bld 135 (*)    Creatinine, Ser 0.43 (*)    All other components within normal limits  CBC  I-STAT TROPOININ,  ED   Imaging Review Dg Chest 2 View  06/28/2013   CLINICAL DATA:  Shortness of breath and chest pain.  EXAM: CHEST  2 VIEW  COMPARISON:  PA and lateral chest 12/06/2009.  FINDINGS: Lungs clear. Heart size mildly enlarged. No pneumothorax or pleural effusion. Convex left thoracolumbar scoliosis noted.  IMPRESSION: No acute disease.   Electronically Signed   By: Inge Rise M.D.   On: 06/28/2013 00:08     EKG Interpretation   Date/Time:  Monday June 27 2013 22:46:20 EDT Ventricular Rate:  79 PR Interval:  144 QRS Duration: 80 QT Interval:  404 QTC Calculation: 463 R Axis:   61 Text Interpretation:  Normal sinus rhythm Possible Left atrial enlargement   Borderline ECG Confirmed by DELOS  MD, Blimi Godby ([id number]) on 06/29/2013  4:40:28 AM      MDM   Final diagnoses:  Chest pain  Hypertension    Patient presents here with chest discomfort is atypical for cardiac pain. EKG and troponin were unremarkable. Her blood pressures were elevated at home prior to arrival, however they have been adequate here. Patient is now feeling better and I believe is appropriate for discharge given the negative cardiac workup. I've advised her to follow her blood pressures and keep a record of these to discuss with her primary Dr. at her next appointment.    Veryl Speak, MD 06/29/13 [phone]

## 2013-07-29 ENCOUNTER — Encounter: Payer: Self-pay | Admitting: "Endocrinology

## 2013-08-15 ENCOUNTER — Other Ambulatory Visit: Payer: Self-pay | Admitting: Gastroenterology

## 2013-08-15 DIAGNOSIS — R1012 Left upper quadrant pain: Secondary | ICD-10-CM

## 2013-08-18 ENCOUNTER — Inpatient Hospital Stay: Admission: RE | Admit: 2013-08-18 | Payer: Self-pay | Source: Ambulatory Visit

## 2013-08-22 ENCOUNTER — Ambulatory Visit
Admission: RE | Admit: 2013-08-22 | Discharge: 2013-08-22 | Disposition: A | Discharge status: Home / Self Care | Payer: No Typology Code available for payment source | Source: Ambulatory Visit | Attending: Gastroenterology | Admitting: Gastroenterology

## 2013-08-22 MED ORDER — IOHEXOL 300 MG/ML  SOLN
100.0000 mL | Freq: Once | INTRAMUSCULAR | Status: AC | PRN
  Administered 2013-08-22: 100 mL via INTRAVENOUS

## 2013-09-29 ENCOUNTER — Other Ambulatory Visit: Payer: Self-pay | Admitting: Gastroenterology

## 2013-09-30 ENCOUNTER — Encounter (HOSPITAL_COMMUNITY)
Admission: RE | Disposition: A | Discharge status: Home / Self Care | Payer: Self-pay | Source: Ambulatory Visit | Attending: Gastroenterology

## 2013-09-30 ENCOUNTER — Ambulatory Visit (HOSPITAL_COMMUNITY)
Admission: RE | Admit: 2013-09-30 | Discharge: 2013-09-30 | Disposition: A | Discharge status: Home / Self Care | Payer: No Typology Code available for payment source | Source: Ambulatory Visit | Attending: Gastroenterology | Admitting: Gastroenterology

## 2013-09-30 ENCOUNTER — Encounter (HOSPITAL_COMMUNITY): Payer: Self-pay

## 2013-09-30 DIAGNOSIS — R1012 Left upper quadrant pain: Secondary | ICD-10-CM | POA: Insufficient documentation

## 2013-09-30 DIAGNOSIS — B181 Chronic viral hepatitis B without delta-agent: Secondary | ICD-10-CM | POA: Insufficient documentation

## 2013-09-30 DIAGNOSIS — Z79899 Other long term (current) drug therapy: Secondary | ICD-10-CM | POA: Insufficient documentation

## 2013-09-30 DIAGNOSIS — K2940 Chronic atrophic gastritis without bleeding: Secondary | ICD-10-CM | POA: Insufficient documentation

## 2013-09-30 HISTORY — PX: ESOPHAGOGASTRODUODENOSCOPY: SHX5428

## 2013-09-30 SURGERY — EGD (ESOPHAGOGASTRODUODENOSCOPY)
Anesthesia: Moderate Sedation

## 2013-09-30 MED ORDER — FENTANYL CITRATE 0.05 MG/ML IJ SOLN
INTRAMUSCULAR | Status: AC
  Filled 2013-09-30: qty 2

## 2013-09-30 MED ORDER — SODIUM CHLORIDE 0.9 % IV SOLN
INTRAVENOUS | Status: DC
  Administered 2013-09-30: 500 mL via INTRAVENOUS

## 2013-09-30 MED ORDER — MIDAZOLAM HCL 10 MG/2ML IJ SOLN
INTRAMUSCULAR | Status: DC | PRN
  Administered 2013-09-30 (×2): 2 mg via INTRAVENOUS
  Administered 2013-09-30: 1 mg via INTRAVENOUS

## 2013-09-30 MED ORDER — FENTANYL CITRATE 0.05 MG/ML IJ SOLN
INTRAMUSCULAR | Status: DC | PRN
  Administered 2013-09-30 (×2): 25 ug via INTRAVENOUS

## 2013-09-30 MED ORDER — BUTAMBEN-TETRACAINE-BENZOCAINE 2-2-14 % EX AERO
INHALATION_SPRAY | CUTANEOUS | Status: DC | PRN
  Administered 2013-09-30: 2 via TOPICAL

## 2013-09-30 MED ORDER — MIDAZOLAM HCL 10 MG/2ML IJ SOLN
INTRAMUSCULAR | Status: AC
  Filled 2013-09-30: qty 2

## 2013-09-30 NOTE — Addendum Note (Signed)
Addended by: Vernie Ammons. on: 09/30/2013 07:40 AM   Modules accepted: Orders

## 2013-09-30 NOTE — H&P (Signed)
  Subjective:   Patient is a 58 y.o. female presents with left upper quadrant abdominal pain. CT scan suggested 8th 2 cm mass of the stomach. This was a soft tissue mass and the proximal stomach. Because she is having pain in abnormal CT EGD is to be performed. Procedure including risks and benefits discussed in office.  There are no active problems to display for this patient.  Past Medical History  Diagnosis Date  . Hypertension   . Hepatitis B     History reviewed. No pertinent past surgical history.  Prescriptions prior to admission  Medication Sig Dispense Refill  . hydrOXYzine (ATARAX/VISTARIL) 50 MG tablet Take 25,050 mg by mouth at bedtime as needed (for sleep).      . meloxicam (MOBIC) 15 MG tablet Take 1 tablet (15 mg total) by mouth daily.  30 tablet  2  . pantoprazole (PROTONIX) 40 MG tablet Take 40 mg by mouth daily.       No Known Allergies  History  Substance Use Topics  . Smoking status: Never Smoker   . Smokeless tobacco: Not on file  . Alcohol Use: No    History reviewed. No pertinent family history.   Objective:   No data found.        See MD Preop evaluation      Assessment:   1. Abdominal pain/abnormal CT  Plan:   We will proceed with EGD at this time. This is been discussed in the office with the patient via interpreter.

## 2013-09-30 NOTE — Discharge Instructions (Signed)
Esophagogastroduodenoscopy Care After Refer to this sheet in the next few weeks. These instructions provide you with information on caring for yourself after your procedure. Your caregiver may also give you more specific instructions. Your treatment has been planned according to current medical practices, but problems sometimes occur. Call your caregiver if you have any problems or questions after your procedure.  HOME CARE INSTRUCTIONS  Do not eat or drink anything until the numbing medicine (local anesthetic) has worn off and your gag reflex has returned. You will know that the local anesthetic has worn off when you can swallow comfortably.  Do not drive for 12 hours after the procedure or as directed by your caregiver.  Only take medicines as directed by your caregiver. SEEK MEDICAL CARE IF:   You cannot stop coughing.  You are not urinating at all or less than usual. SEEK IMMEDIATE MEDICAL CARE IF:  You have difficulty swallowing.  You cannot eat or drink.  You have worsening throat or chest pain.  You have dizziness, lightheadedness, or you faint.  You have nausea or vomiting.  You have chills.  You have a fever.  You have severe abdominal pain.  You have black, tarry, or bloody stools. Document Released: 03/17/2012 Document Reviewed: 03/17/2012 [facility] Patient Information 2015 [place]. This information is not intended to replace advice given to you by your health care provider. Make sure you discuss any questions you have with your health care provider. Esophagogastroduodenoscopy Care After Refer to this sheet in the next few weeks. These instructions provide you with information on caring for yourself after your procedure. Your caregiver may also give you more specific instructions. Your treatment has been planned according to current medical practices, but problems sometimes occur. Call your caregiver if you have any problems or questions after your procedure.    HOME CARE INSTRUCTIONS  Do not eat or drink anything until the numbing medicine (local anesthetic) has worn off and your gag reflex has returned. You will know that the local anesthetic has worn off when you can swallow comfortably.  Do not drive for 12 hours after the procedure or as directed by your caregiver.  Only take medicines as directed by your caregiver. SEEK MEDICAL CARE IF:   You cannot stop coughing.  You are not urinating at all or less than usual. SEEK IMMEDIATE MEDICAL CARE IF:  You have difficulty swallowing.  You cannot eat or drink.  You have worsening throat or chest pain.  You have dizziness, lightheadedness, or you faint.  You have nausea or vomiting.  You have chills.  You have a fever.  You have severe abdominal pain.  You have black, tarry, or bloody stools. Document Released: 03/17/2012 Document Reviewed: 03/17/2012 [facility] Patient Information 2015 [place]. This information is not intended to replace advice given to you by your health care provider. Make sure you discuss any questions you have with your health care provider.

## 2013-09-30 NOTE — Op Note (Signed)
[facility] [place], [address]   ENDOSCOPY PROCEDURE REPORT  PATIENT: Lynn Fleming, Lynn Fleming  MR#: [id number] BIRTHDATE: 18-Sep-1955 , 58  yrs. old GENDER: Female ENDOSCOPIST:James Oletta Lamas, MD REFERRED BY: [facility] PROCEDURE DATE:  09/30/2013 PROCEDURE:      EGD with biopsy ASA CLASS:    class 2 INDICATIONS:   [place] who has had upper abdominal pain. CT scan showed 2 cm soft tissue mass and the proximal stomach. Patient has had previous EGD with gastric biopsies by another G.I. that could show eosinophilic infiltration. No clear if this was the mass or not. MEDICATION:  fentanyl 50 mcg versed 5 mg IV TOPICAL ANESTHETIC:   cetacaine spray  DESCRIPTION OF PROCEDURE:   The Pentax upper scope was inserted into the esophagus swallowing. The esophagus was normal. There were no varices or other gross abnormalities. The stomach was entered worse identifying past. The duodenum including the bulb and 2nd portion were endoscopically normal. The scope is withdrawn back into the stomach and the antrum of the stomach was normal. The fundus was seen fairly well in the retroflex view and was normal. in the proximal stomach the 2 cm mass was seen. It appeared to be covered with normal mucosal. There was no pillow  and it appeared to be firm. It was biopsies. The scope was withdrawn in the patient tolerated procedure well.     COMPLICATIONS: None  ENDOSCOPIC IMPRESSION: 1. 2 cm mass and proximal stomach. Appears submucosal. Not clear if this is incidental or related to her chronic left upper quadrant pain. 2. Chronic left upper quadrant pain of unclear cause  RECOMMENDATIONS: 1.we will get this path report back. If he biopsies are in revealing will discuss EUS and biopsy with Dr. Benson Norway..    _______________________________ Lorrin MaisLaurence Spates, MD 09/30/2013 2:42 PM  cc: Dr. Benson Norway

## 2013-10-03 ENCOUNTER — Encounter (HOSPITAL_COMMUNITY): Payer: Self-pay | Admitting: Gastroenterology

## 2013-11-03 ENCOUNTER — Other Ambulatory Visit: Payer: Self-pay | Admitting: Gastroenterology

## 2013-11-04 NOTE — Addendum Note (Signed)
Addended by: Arta Silence on: 11/04/2013 08:31 AM   Modules accepted: Orders

## 2013-11-09 ENCOUNTER — Encounter (HOSPITAL_COMMUNITY)
Admission: RE | Disposition: A | Discharge status: Home / Self Care | Payer: No Typology Code available for payment source | Source: Ambulatory Visit | Attending: Gastroenterology

## 2013-11-09 ENCOUNTER — Encounter (HOSPITAL_COMMUNITY): Payer: Self-pay | Admitting: Anesthesiology

## 2013-11-09 ENCOUNTER — Ambulatory Visit (HOSPITAL_COMMUNITY): Payer: No Typology Code available for payment source | Admitting: Anesthesiology

## 2013-11-09 ENCOUNTER — Ambulatory Visit (HOSPITAL_COMMUNITY)
Admission: RE | Admit: 2013-11-09 | Discharge: 2013-11-09 | Disposition: A | Discharge status: Home / Self Care | Payer: Self-pay | Source: Ambulatory Visit | Attending: Gastroenterology | Admitting: Gastroenterology

## 2013-11-09 ENCOUNTER — Encounter (HOSPITAL_COMMUNITY): Payer: Self-pay | Admitting: *Deleted

## 2013-11-09 DIAGNOSIS — B181 Chronic viral hepatitis B without delta-agent: Secondary | ICD-10-CM | POA: Insufficient documentation

## 2013-11-09 DIAGNOSIS — Z79899 Other long term (current) drug therapy: Secondary | ICD-10-CM | POA: Insufficient documentation

## 2013-11-09 DIAGNOSIS — I10 Essential (primary) hypertension: Secondary | ICD-10-CM | POA: Insufficient documentation

## 2013-11-09 DIAGNOSIS — K319 Disease of stomach and duodenum, unspecified: Secondary | ICD-10-CM | POA: Insufficient documentation

## 2013-11-09 HISTORY — PX: EUS: SHX5427

## 2013-11-09 SURGERY — ESOPHAGEAL ENDOSCOPIC ULTRASOUND (EUS) RADIAL
Anesthesia: Monitor Anesthesia Care

## 2013-11-09 MED ORDER — PROPOFOL 10 MG/ML IV BOLUS
INTRAVENOUS | Status: AC
  Filled 2013-11-09: qty 20

## 2013-11-09 MED ORDER — PROPOFOL 10 MG/ML IV BOLUS
INTRAVENOUS | Status: DC | PRN
  Administered 2013-11-09 (×2): 25 mg via INTRAVENOUS
  Administered 2013-11-09 (×3): 50 mg via INTRAVENOUS
  Administered 2013-11-09 (×2): 25 mg via INTRAVENOUS

## 2013-11-09 MED ORDER — LACTATED RINGERS IV SOLN
INTRAVENOUS | Status: DC | PRN
  Administered 2013-11-09: 11:00:00 via INTRAVENOUS

## 2013-11-09 MED ORDER — SODIUM CHLORIDE 0.9 % IV SOLN: INTRAVENOUS | Status: DC

## 2013-11-09 MED ORDER — LACTATED RINGERS IV SOLN
INTRAVENOUS | Status: DC
  Administered 2013-11-09: 12:00:00 via INTRAVENOUS

## 2013-11-09 NOTE — Transfer of Care (Signed)
Immediate Anesthesia Transfer of Care Note  Patient: Lynn Fleming  Procedure(s) Performed: Procedure(s): ESOPHAGEAL ENDOSCOPIC ULTRASOUND (EUS) RADIAL (N/A)  Patient Location: PACU  Anesthesia Type:MAC  Level of Consciousness: awake, sedated and patient cooperative  Airway & Oxygen Therapy: Patient Spontanous Breathing and Patient connected to face mask oxygen  Post-op Assessment: Report given to PACU RN and Post -op Vital signs reviewed and stable  Post vital signs: Reviewed and stable  Complications: No apparent anesthesia complications

## 2013-11-09 NOTE — Anesthesia Postprocedure Evaluation (Signed)
  Anesthesia Post-op Note  Patient: Lynn Fleming  Procedure(s) Performed: Procedure(s) (LRB): ESOPHAGEAL ENDOSCOPIC ULTRASOUND (EUS) RADIAL (N/A)  Patient Location: PACU  Anesthesia Type: MAC  Level of Consciousness: awake and alert   Airway and Oxygen Therapy: Patient Spontanous Breathing  Post-op Pain: mild  Post-op Assessment: Post-op Vital signs reviewed, Patient's Cardiovascular Status Stable, Respiratory Function Stable, Patent Airway and No signs of Nausea or vomiting  Last Vitals:  Filed Vitals:   11/09/13 1343  BP: 140/84  Pulse: 65  Temp:   Resp: 14    Post-op Vital Signs: stable   Complications: No apparent anesthesia complications

## 2013-11-09 NOTE — Discharge Instructions (Signed)
Endoscopic Ultrasound ° °Care After °Please read the instructions outlined below and refer to this sheet in the next few weeks. These discharge instructions provide you with general information on caring for yourself after you leave the hospital. Your doctor may also give you specific instructions. While your treatment has been planned according to the most current medical practices available, unavoidable complications occasionally occur. If you have any problems or questions after discharge, please call Dr. Filip Luten ([facility]) at [phone]. ° °HOME CARE INSTRUCTIONS °Activity °· You may resume your regular activity but move at a slower pace for the next 24 hours.  °· Take frequent rest periods for the next 24 hours.  °· Walking will help expel (get rid of) the air and reduce the bloated feeling in your abdomen.  °· No driving for 24 hours (because of the anesthesia (medicine) used during the test).  °· You may shower.  °· Do not sign any important legal documents or operate any machinery for 24 hours (because of the anesthesia used during the test).  °Nutrition °· Drink plenty of fluids.  °· You may resume your normal diet.  °· Begin with a light meal and progress to your normal diet.  °· Avoid alcoholic beverages for 24 hours or as instructed by your caregiver.  °Medications °You may resume your normal medications unless your caregiver tells you otherwise. °What you can expect today °· You may experience abdominal discomfort such as a feeling of fullness or "gas" pains.  °· You may experience a sore throat for 2 to 3 days. This is normal. Gargling with salt water may help this.  °·  °SEEK IMMEDIATE MEDICAL CARE IF: °· You have excessive nausea (feeling sick to your stomach) and/or vomiting.  °· You have severe abdominal pain and distention (swelling).  °· You have trouble swallowing.  °· You have a temperature over 100° F (37.8° C).  °· You have rectal bleeding or vomiting of blood.  °Document  Released: 11/13/2003 Document Revised: 12/11/2010 Document Reviewed: 05/26/2007 °[employer]® Patient Information ©2012 [employer]. °

## 2013-11-09 NOTE — Anesthesia Preprocedure Evaluation (Signed)
Anesthesia Evaluation  Patient identified by MRN, date of birth, ID band Patient awake    Reviewed: Allergy & Precautions, H&P , NPO status , Patient's Chart, lab work & pertinent test results  Airway Mallampati: II TM Distance: >3 FB Neck ROM: Full    Dental no notable dental hx.    Pulmonary neg pulmonary ROS,  breath sounds clear to auscultation  Pulmonary exam normal       Cardiovascular hypertension, Pt. on medications Rhythm:Regular Rate:Normal     Neuro/Psych negative neurological ROS  negative psych ROS   GI/Hepatic negative GI ROS, (+) Hepatitis -, B  Endo/Other  negative endocrine ROS  Renal/GU negative Renal ROS  negative genitourinary   Musculoskeletal negative musculoskeletal ROS (+)   Abdominal   Peds negative pediatric ROS (+)  Hematology negative hematology ROS (+)   Anesthesia Other Findings   Reproductive/Obstetrics negative OB ROS                           Anesthesia Physical Anesthesia Plan  ASA: II  Anesthesia Plan: MAC   Post-op Pain Management:    Induction: Intravenous  Airway Management Planned: Nasal Cannula  Additional Equipment:   Intra-op Plan:   Post-operative Plan:   Informed Consent: I have reviewed the patients History and Physical, chart, labs and discussed the procedure including the risks, benefits and alternatives for the proposed anesthesia with the patient or authorized representative who has indicated his/her understanding and acceptance.   Dental advisory given  Plan Discussed with: CRNA and Surgeon  Anesthesia Plan Comments:         Anesthesia Quick Evaluation

## 2013-11-09 NOTE — Op Note (Signed)
[facility] [place], [address]   ENDOSCOPIC ULTRASOUND PROCEDURE REPORT  PATIENT: Lynn Fleming, Lynn Fleming  MR#: [id number] BIRTHDATE: 1956/02/18  GENDER: Female ENDOSCOPIST: Arta Silence, MD REFERRED BY:  Laurence Spates, M.D. PROCEDURE DATE:  11/09/2013 PROCEDURE:   Upper EUS ASA CLASS:      Class II INDICATIONS:   1.  gastric nodule. MEDICATIONS: MAC sedation, administered by CRNA  DESCRIPTION OF PROCEDURE:   After the risks benefits and alternatives of the procedure were  explained, informed consent was obtained. The patient was then placed in the left, lateral, decubitus postion and IV sedation was administered. Throughout the procedure, the patients blood pressure, pulse and oxygen saturations were monitored continuously.  Under direct visualization, the EUS scope  endoscope was introduced through the mouth  and advanced to the second portion of the duodenum .  Water was used as necessary to provide an acoustic interface.  Upon completion of the imaging, water was removed and the patient was sent to the recovery room in satisfactory condition.    FINDINGS:      Well-defined round submucosal-appearing nodule along the lesser curvature of the proximal gastric body was noted. Gastric lumen was gently instilled with water to facilitate acoustic coupling..  The nodule is about 15 x 102mm in diameter, is round, well-defined, and hypoechoic.  It appears to arise from the muscularis propria.  There is no neighboring adenopathy.  Limited views of left lobe of liver were normal.  IMPRESSION:     As above.  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  I can't imagine this causing any abdominal pain.   RECOMMENDATIONS:     1.  Watch for potential complications or procedure. 2.  Repeat EGD +/- EUS in 2 years for surveillance. 3.  Follow-up with Dr. Oletta Lamas, as needed, for ongoing management of her abdominal  pain.   _______________________________ eSigned:  Arta Silence, MD 11/09/2013 1:43 PM   CC:

## 2013-11-09 NOTE — H&P (Signed)
Patient interval history reviewed.  Patient examined again.  There has been no change from documented H/P dated 11/03/13 (scanned into chart from our office) except as documented above.  Assessment:  1.  Gastric nodule.  Plan:  1.  Endoscopic ultrasound +/- FNA biopsies. 2.  Risks (bleeding, infection, bowel perforation that could require surgery, sedation-related changes in cardiopulmonary systems), benefits (identification and possible treatment of source of symptoms, exclusion of certain causes of symptoms), and alternatives (watchful waiting, radiographic imaging studies, empiric medical treatment) of upper endoscopy with ultrasound and possible biopsies (EUS +/- FNA) were explained to patient in detail (through a Language [employer] interpreter) and patient wishes to proceed.

## 2013-11-10 ENCOUNTER — Encounter (HOSPITAL_COMMUNITY): Payer: Self-pay | Admitting: Gastroenterology

## 2013-12-11 ENCOUNTER — Encounter (HOSPITAL_COMMUNITY): Payer: Self-pay | Admitting: Emergency Medicine

## 2013-12-11 ENCOUNTER — Emergency Department (HOSPITAL_COMMUNITY): Payer: Self-pay

## 2013-12-11 ENCOUNTER — Observation Stay (HOSPITAL_COMMUNITY)
Admission: EM | Admit: 2013-12-11 | Discharge: 2013-12-12 | Disposition: A | Discharge status: Home / Self Care | Payer: Self-pay | Attending: Internal Medicine | Admitting: Internal Medicine

## 2013-12-11 DIAGNOSIS — Z8619 Personal history of other infectious and parasitic diseases: Secondary | ICD-10-CM | POA: Insufficient documentation

## 2013-12-11 DIAGNOSIS — R51 Headache: Secondary | ICD-10-CM | POA: Insufficient documentation

## 2013-12-11 DIAGNOSIS — R634 Abnormal weight loss: Secondary | ICD-10-CM | POA: Insufficient documentation

## 2013-12-11 DIAGNOSIS — G459 Transient cerebral ischemic attack, unspecified: Principal | ICD-10-CM | POA: Insufficient documentation

## 2013-12-11 DIAGNOSIS — K2940 Chronic atrophic gastritis without bleeding: Secondary | ICD-10-CM | POA: Insufficient documentation

## 2013-12-11 DIAGNOSIS — H538 Other visual disturbances: Secondary | ICD-10-CM

## 2013-12-11 DIAGNOSIS — I10 Essential (primary) hypertension: Secondary | ICD-10-CM | POA: Insufficient documentation

## 2013-12-11 DIAGNOSIS — R519 Headache, unspecified: Secondary | ICD-10-CM

## 2013-12-11 DIAGNOSIS — D131 Benign neoplasm of stomach: Secondary | ICD-10-CM | POA: Insufficient documentation

## 2013-12-11 LAB — I-STAT CHEM 8, ED
BUN: 10 mg/dL (ref 6–23)
CHLORIDE: 108 meq/L (ref 96–112)
Calcium, Ion: 1.1 mmol/L — ABNORMAL LOW (ref 1.13–1.30)
Creatinine, Ser: 0.5 mg/dL (ref 0.50–1.10)
Glucose, Bld: 145 mg/dL — ABNORMAL HIGH (ref 70–99)
HCT: 40 % (ref 36.0–46.0)
Hemoglobin: 13.6 g/dL (ref 12.0–15.0)
Potassium: 3.5 mEq/L — ABNORMAL LOW (ref 3.7–5.3)
Sodium: 143 mEq/L (ref 137–147)
TCO2: 25 mmol/L (ref 0–100)

## 2013-12-11 LAB — I-STAT CG4 LACTIC ACID, ED: Lactic Acid, Venous: 2.07 mmol/L (ref 0.5–2.2)

## 2013-12-11 NOTE — ED Notes (Signed)
Patient at bridge, EDP, stroke team, and neurologist.

## 2013-12-12 ENCOUNTER — Observation Stay (HOSPITAL_COMMUNITY): Payer: No Typology Code available for payment source

## 2013-12-12 DIAGNOSIS — H538 Other visual disturbances: Secondary | ICD-10-CM

## 2013-12-12 DIAGNOSIS — R29810 Facial weakness: Secondary | ICD-10-CM

## 2013-12-12 DIAGNOSIS — G459 Transient cerebral ischemic attack, unspecified: Principal | ICD-10-CM

## 2013-12-12 DIAGNOSIS — R42 Dizziness and giddiness: Secondary | ICD-10-CM

## 2013-12-12 DIAGNOSIS — R519 Headache, unspecified: Secondary | ICD-10-CM | POA: Diagnosis present

## 2013-12-12 DIAGNOSIS — R51 Headache: Secondary | ICD-10-CM

## 2013-12-12 LAB — HEMOGLOBIN A1C
HEMOGLOBIN A1C: 6 % — AB (ref <5.7)
MEAN PLASMA GLUCOSE: 126 mg/dL — AB (ref <117)

## 2013-12-12 LAB — CBC WITH DIFFERENTIAL/PLATELET
BASOS PCT: 0 % (ref 0–1)
Basophils Absolute: 0 10*3/uL (ref 0.0–0.1)
EOS PCT: 1 % (ref 0–5)
Eosinophils Absolute: 0 10*3/uL (ref 0.0–0.7)
HEMATOCRIT: 41.4 % (ref 36.0–46.0)
HEMOGLOBIN: 13.7 g/dL (ref 12.0–15.0)
LYMPHS ABS: 2.2 10*3/uL (ref 0.7–4.0)
Lymphocytes Relative: 43 % (ref 12–46)
MCH: 32 pg (ref 26.0–34.0)
MCHC: 33.1 g/dL (ref 30.0–36.0)
MCV: 96.7 fL (ref 78.0–100.0)
MONOS PCT: 5 % (ref 3–12)
Monocytes Absolute: 0.3 10*3/uL (ref 0.1–1.0)
Neutro Abs: 2.6 10*3/uL (ref 1.7–7.7)
Neutrophils Relative %: 51 % (ref 43–77)
Platelets: 242 10*3/uL (ref 150–400)
RBC: 4.28 MIL/uL (ref 3.87–5.11)
RDW: 12.5 % (ref 11.5–15.5)
WBC: 5.2 10*3/uL (ref 4.0–10.5)

## 2013-12-12 LAB — COMPREHENSIVE METABOLIC PANEL
ALK PHOS: 92 U/L (ref 39–117)
ALT: 8 U/L (ref 0–35)
ALT: 9 U/L (ref 0–35)
AST: 14 U/L (ref 0–37)
AST: 15 U/L (ref 0–37)
Albumin: 4.1 g/dL (ref 3.5–5.2)
Albumin: 4.2 g/dL (ref 3.5–5.2)
Alkaline Phosphatase: 102 U/L (ref 39–117)
Anion gap: 13 (ref 5–15)
Anion gap: 14 (ref 5–15)
BUN: 10 mg/dL (ref 6–23)
BUN: 7 mg/dL (ref 6–23)
CO2: 23 meq/L (ref 19–32)
CO2: 25 mEq/L (ref 19–32)
CREATININE: 0.51 mg/dL (ref 0.50–1.10)
Calcium: 9.3 mg/dL (ref 8.4–10.5)
Calcium: 9.8 mg/dL (ref 8.4–10.5)
Chloride: 105 mEq/L (ref 96–112)
Chloride: 108 mEq/L (ref 96–112)
Creatinine, Ser: 0.44 mg/dL — ABNORMAL LOW (ref 0.50–1.10)
GFR calc Af Amer: >90 mL/min (ref >90)
GFR calc non Af Amer: >90 mL/min (ref >90)
GLUCOSE: 118 mg/dL — AB (ref 70–99)
Glucose, Bld: 142 mg/dL — ABNORMAL HIGH (ref 70–99)
POTASSIUM: 3.7 meq/L (ref 3.7–5.3)
Potassium: 3.6 mEq/L — ABNORMAL LOW (ref 3.7–5.3)
Sodium: 141 mEq/L (ref 137–147)
Sodium: 147 mEq/L (ref 137–147)
Total Bilirubin: 0.3 mg/dL (ref 0.3–1.2)
Total Bilirubin: 0.5 mg/dL (ref 0.3–1.2)
Total Protein: 8 g/dL (ref 6.0–8.3)
Total Protein: 8 g/dL (ref 6.0–8.3)

## 2013-12-12 LAB — DIFFERENTIAL
BASOS PCT: 0 % (ref 0–1)
Basophils Absolute: 0 10*3/uL (ref 0.0–0.1)
Eosinophils Absolute: 0.1 10*3/uL (ref 0.0–0.7)
Eosinophils Relative: 1 % (ref 0–5)
Lymphocytes Relative: 45 % (ref 12–46)
Lymphs Abs: 2.7 10*3/uL (ref 0.7–4.0)
Monocytes Absolute: 0.4 10*3/uL (ref 0.1–1.0)
Monocytes Relative: 6 % (ref 3–12)
NEUTROS ABS: 2.9 10*3/uL (ref 1.7–7.7)
NEUTROS PCT: 48 % (ref 43–77)

## 2013-12-12 LAB — CBC
HCT: 38.7 % (ref 36.0–46.0)
Hemoglobin: 13.5 g/dL (ref 12.0–15.0)
MCH: 32.5 pg (ref 26.0–34.0)
MCHC: 34.9 g/dL (ref 30.0–36.0)
MCV: 93.3 fL (ref 78.0–100.0)
PLATELETS: 222 10*3/uL (ref 150–400)
RBC: 4.15 MIL/uL (ref 3.87–5.11)
RDW: 12.2 % (ref 11.5–15.5)
WBC: 6 10*3/uL (ref 4.0–10.5)

## 2013-12-12 LAB — URINALYSIS, ROUTINE W REFLEX MICROSCOPIC
BILIRUBIN URINE: NEGATIVE
GLUCOSE, UA: NEGATIVE mg/dL
Ketones, ur: NEGATIVE mg/dL
LEUKOCYTES UA: NEGATIVE
NITRITE: NEGATIVE
PH: 7.5 (ref 5.0–8.0)
Protein, ur: NEGATIVE mg/dL
SPECIFIC GRAVITY, URINE: 1.008 (ref 1.005–1.030)
Urobilinogen, UA: 0.2 mg/dL (ref 0.0–1.0)

## 2013-12-12 LAB — RAPID URINE DRUG SCREEN, HOSP PERFORMED
Amphetamines: NOT DETECTED
BARBITURATES: NOT DETECTED
Benzodiazepines: NOT DETECTED
Cocaine: NOT DETECTED
Opiates: NOT DETECTED
Tetrahydrocannabinol: NOT DETECTED

## 2013-12-12 LAB — LIPID PANEL
CHOLESTEROL: 152 mg/dL (ref 0–200)
HDL: 50 mg/dL (ref >39)
LDL Cholesterol: 81 mg/dL (ref 0–99)
TRIGLYCERIDES: 104 mg/dL (ref <150)
Total CHOL/HDL Ratio: 3 RATIO
VLDL: 21 mg/dL (ref 0–40)

## 2013-12-12 LAB — PROTIME-INR
INR: 0.96 (ref 0.00–1.49)
PROTHROMBIN TIME: 12.8 s (ref 11.6–15.2)

## 2013-12-12 LAB — ETHANOL

## 2013-12-12 LAB — URINE MICROSCOPIC-ADD ON

## 2013-12-12 LAB — I-STAT TROPONIN, ED: TROPONIN I, POC: 0 ng/mL (ref 0.00–0.08)

## 2013-12-12 LAB — APTT: aPTT: 25 seconds (ref 24–37)

## 2013-12-12 MED ORDER — PROCHLORPERAZINE EDISYLATE 5 MG/ML IJ SOLN
10.0000 mg | Freq: Four times a day (QID) | INTRAMUSCULAR | Status: DC | PRN
  Administered 2013-12-12: 10 mg via INTRAVENOUS
  Filled 2013-12-12 (×2): qty 2

## 2013-12-12 MED ORDER — DIPHENHYDRAMINE HCL 50 MG/ML IJ SOLN
25.0000 mg | Freq: Once | INTRAMUSCULAR | Status: AC
  Administered 2013-12-12: 25 mg via INTRAVENOUS
  Filled 2013-12-12: qty 1

## 2013-12-12 MED ORDER — SODIUM CHLORIDE 0.9 % IV BOLUS (SEPSIS)
1000.0000 mL | Freq: Once | INTRAVENOUS | Status: AC
  Administered 2013-12-12: 1000 mL via INTRAVENOUS

## 2013-12-12 MED ORDER — ENOXAPARIN SODIUM 30 MG/0.3ML ~~LOC~~ SOLN
30.0000 mg | SUBCUTANEOUS | Status: DC
  Administered 2013-12-12: 30 mg via SUBCUTANEOUS
  Filled 2013-12-12: qty 0.3

## 2013-12-12 MED ORDER — ASPIRIN 81 MG PO CHEW: 81.0000 mg | CHEWABLE_TABLET | Freq: Every day | ORAL | Status: DC

## 2013-12-12 MED ORDER — VALPROATE SODIUM 500 MG/5ML IV SOLN
500.0000 mg | Freq: Two times a day (BID) | INTRAVENOUS | Status: DC
  Administered 2013-12-12: 500 mg via INTRAVENOUS
  Filled 2013-12-12 (×3): qty 5

## 2013-12-12 MED ORDER — STROKE: EARLY STAGES OF RECOVERY BOOK
Freq: Once | Status: DC
  Filled 2013-12-12: qty 1

## 2013-12-12 MED ORDER — ASPIRIN 81 MG PO CHEW
81.0000 mg | CHEWABLE_TABLET | Freq: Every day | ORAL | Status: DC
  Administered 2013-12-12: 81 mg via ORAL
  Filled 2013-12-12: qty 1

## 2013-12-12 NOTE — Evaluation (Signed)
Physical Therapy Evaluation Patient Details Name: Lynn Fleming MRN: [id number] DOB: 06/12/1955 Today's Date: 12/12/2013   History of Present Illness  58 y.o. female admitted to [facility] on 12/11/13 with HA, dizziness, and facial droop.  Stroke workup is pending. MRI is negative for acute infarct.  Pt with significant PMhx of HTN, and Hepatits B.   Clinical Impression  Pt is independent in mobility.  Daughter reports she feels like her normal self.  Strength, coordination, and sensation are WNL.  Balance tested and WNL.  Pt at baseline has slow walking speed.  There are no obvious changes in her vision, and oculomotor testing was WNL.  PT to sign off.  Screened for OT needs, none needed.      Follow Up Recommendations No PT follow up    Equipment Recommendations  None recommended by PT    Recommendations for Other Services   NA    Precautions / Restrictions Precautions Precautions: None      Mobility  Bed Mobility Overal bed mobility: Independent                Transfers Overall transfer level: Independent Equipment used: None                Ambulation/Gait Ambulation/Gait assistance: Independent Ambulation Distance (Feet): 30 Feet Assistive device: None Gait Pattern/deviations: WFL(Within Functional Limits) Gait velocity: decreased Gait velocity interpretation: Below normal speed for age/gender General Gait Details: pt with slow gait pattern, per daughter this is her normal          Balance Overall balance assessment: Needs assistance                           High level balance activites: Turns;Direction changes;Other (comment) (pick up object from the floor) High Level Balance Comments: With all these high level balance activities, pt did not have any reports of dizziness or signs of imbalance.  Pt's gait limited to room as daughter was translating.              Pertinent Vitals/Pain Pain Assessment: No/denies pain    Home  Living Family/patient expects to be discharged to:: Private residence Living Arrangements: Children Available Help at Discharge: Family;Available 24 hours/day Type of Home: House Home Access: Stairs to enter   [employer] of Steps: 2 Home Layout: One level Home Equipment: None Additional Comments: per daughter mom doesn't go out, she stays in the house all the time.     Prior Function Level of Independence: Independent                  Extremity/Trunk Assessment   Upper Extremity Assessment: Overall WFL for tasks assessed (strength, coordination)           Lower Extremity Assessment: Overall WFL for tasks assessed (strength, coordination, and sensation)      Cervical / Trunk Assessment: Normal  Communication   Communication: Prefers language other than English  Cognition Arousal/Alertness: Awake/alert Behavior During Therapy: WFL for tasks assessed/performed Overall Cognitive Status: Within Functional Limits for tasks assessed (per daughter report who is translating)                               Assessment/Plan    PT Assessment Patent does not need any further PT services           PT Goals (Current goals can be found in the Care  Plan section) Acute Rehab PT Goals PT Goal Formulation: No goals set, d/c therapy     End of Session Equipment Utilized During Treatment: Gait belt Activity Tolerance: Patient tolerated treatment well Patient left: in chair;with call bell/phone within reach;with family/visitor present      Functional Assessment Tool Used: assist level Functional Limitation: Mobility: Walking and moving around Mobility: Walking and Moving Around Current Status [phone]): 0 percent impaired, limited or restricted Mobility: Walking and Moving Around Goal Status [phone]): 0 percent impaired, limited or restricted Mobility: Walking and Moving Around Discharge Status [phone]): 0 percent impaired, limited or restricted    Time:  7035-0093 PT Time Calculation (min): 10 min   Charges:   PT Evaluation $Initial PT Evaluation Tier I: 1 Procedure     PT G Codes:   Functional Assessment Tool Used: assist level Functional Limitation: Mobility: Walking and moving around    [place] B. Wilsey, [place], DPT [phone]   12/12/2013, 10:05 AM

## 2013-12-12 NOTE — Progress Notes (Signed)
OT Cancellation Note  Patient Details Name: Lynn Fleming MRN: [id number] DOB: [birth year]   Cancelled Treatment:    Reason Eval/Treat Not Completed: OT screened, no needs identified, will sign off. PT notified of no OT needs.  Benito Mccreedy OTR/L [phone] 12/12/2013, 10:35 AM

## 2013-12-12 NOTE — Progress Notes (Signed)
Pt arrived on unit approx 0315 hrs, non english speaking, daughter present as interpreter. No obvious distress noted or expressed, oriented to room, on call notified

## 2013-12-12 NOTE — H&P (Signed)
[facility] History and Physical  Patient: Lynn Fleming  WUX:[id number]  DOB: [birth year]  DOS: the patient was seen and examined on 12/12/2013 PCP: No PCP Per Patient  Chief Complaint: dizziness and facial droop  HPI: Lynn Fleming is a 58 y.o. female with Past medical history of hypertension, gastric polyp, chronic gastritis, weight loss. The patient presented with complaints of generalized fatigue with headache and dizziness. At around 10:30 PM the patient lie down due to severe headache and blurred vision and she has been having similar symptoms on and off since last one week progressively worsening. Throughout the day as per the daughter her blood pressure has been elevated. Patient complains of on and off dizziness 3-4 times a week and daughter mentions that they check her blood pressure at that point and most of the time found her blood pressure elevated. Patient does not take any medication for blood pressure. Patient uses in [place] card and as per the daughter the patient does not take any medication more than one week that has been prescribed as she does not feel any benefit or starts having side effect. Patient also initially reported that she had some right-sided facial weakness. No fever no chills no nausea vomiting abdominal pain no chest pain or shortness of breath reported by patient no diarrhea no constipation reported by daughter. Recently the patient had sore throat and was given some antibiotic the name of which daughter does not remember.   The patient is coming from home. And at her baseline independent for most of her ADL.  Review of Systems: as mentioned in the history of present illness.  A Comprehensive review of the other systems is negative.  Past Medical History  Diagnosis Date  . Hypertension   . Hepatitis B    Past Surgical History  Procedure Laterality Date  . Esophagogastroduodenoscopy N/A 09/30/2013    Procedure:  ESOPHAGOGASTRODUODENOSCOPY (EGD);  Surgeon: Winfield Cunas., MD;  Location: [facility];  Service: Endoscopy;  Laterality: N/A;  . Eus N/A 11/09/2013    Procedure: ESOPHAGEAL ENDOSCOPIC ULTRASOUND (EUS) RADIAL;  Surgeon: Arta Silence, MD;  Location: [facility];  Service: Endoscopy;  Laterality: N/A;   Social History:  reports that she has never smoked. She does not have any smokeless tobacco history on file. She reports that she does not drink alcohol or use illicit drugs.  No Known Allergies  No family history on file.  Prior to Admission medications   Not on File    Physical Exam: Filed Vitals:   12/12/13 0100 12/12/13 0130 12/12/13 0200 12/12/13 0215  BP: 138/77 138/75 151/75 147/84  Pulse: 63 66 70 73  Temp:      TempSrc:      Resp: 14 17 18 18   Height:      Weight:      SpO2: 96% 97% 99% 99%    General: Alert, Awake and Oriented to Time, Place and Person. Appear in mild distress Eyes: PERRL ENT: Oral Mucosa clear moist. Neck: no JVD Cardiovascular: S1 and S2 Present, no Murmur, Peripheral Pulses Present Respiratory: Bilateral Air entry equal and Decreased, Clear to Auscultation, noCrackles, no wheezes Abdomen: Bowel Sound Present, Soft and Non tender Skin: no Rash Extremities: no Pedal edema, no calf tenderness Neurologic: Grossly no focal neuro deficit.  Labs on Admission:  CBC:  Recent Labs Lab 12/11/13 2350 12/11/13 2357  WBC 6.0  --   NEUTROABS 2.9  --   HGB 13.5 13.6  HCT 38.7  40.0  MCV 93.3  --   PLT 222  --     CMP     Component Value Date/Time   NA 143 12/11/2013 2357   K 3.5* 12/11/2013 2357   CL 108 12/11/2013 2357   CO2 23 12/11/2013 2350   GLUCOSE 145* 12/11/2013 2357   BUN 10 12/11/2013 2357   CREATININE 0.50 12/11/2013 2357   CALCIUM 9.8 12/11/2013 2350   PROT 8.0 12/11/2013 2350   ALBUMIN 4.1 12/11/2013 2350   AST 15 12/11/2013 2350   ALT 9 12/11/2013 2350   ALKPHOS 102 12/11/2013 2350   BILITOT 0.3 12/11/2013 2350   GFRNONAA >90  12/11/2013 2350   GFRAA >90 12/11/2013 2350    No results found for this basename: LIPASE, AMYLASE,  in the last 168 hours No results found for this basename: AMMONIA,  in the last 168 hours  No results found for this basename: CKTOTAL, CKMB, CKMBINDEX, TROPONINI,  in the last 168 hours BNP (last 3 results) No results found for this basename: PROBNP,  in the last 8760 hours  Radiological Exams on Admission: Ct Head Wo Contrast  12/12/2013   CLINICAL DATA:  Code stroke, unresponsive  EXAM: CT HEAD WITHOUT CONTRAST  TECHNIQUE: Contiguous axial images were obtained from the base of the skull through the vertex without intravenous contrast.  COMPARISON:  None.  FINDINGS: No intraparenchymal hemorrhage, mass, mass effect, or abnormal extra-axial fluid collection. No definite evidence of an acute infarction. No hydrocephalus. Opacified left sphenoid chamber. Visualized paranasal sinuses and mastoid air cells are otherwise predominantly clear.  IMPRESSION: No CT evidence of an acute intracranial abnormality. Recommend MRI if concern for acute ischemia persists. Discussed via telephone with Dr. Dina Rich at 12 a.m. on 12/12/2013.  Opacified left sphenoid chamber may reflect sinusitis in the appropriate clinical setting.   Electronically Signed   By: Carlos Levering M.D.   On: 12/12/2013 00:05    EKG: Independently reviewed. normal sinus rhythm, nonspecific ST and T waves changes. Assessment/Plan Principal Problem:   TIA (transient ischemic attack) Active Problems:   Headache   1. TIA (transient ischemic attack) The patient is presenting with complaints of headache or dizziness. She was initially seen by neurology as she was brought in as a code stroke. Neurology found the patient had low NIH score and therefore patient was not given any TPA. CT of the head was negative for any acute abnormality. Her examination is pretty much at her baseline at the time of my evaluation. Patient will be admitted to  the hospital for further workup. We will obtain complete stroke work up  telemetry and neuro checks.  2. Headache. Neurology has recommended Depakote would continue to monitor.  3. Hypertension. Daughter mentions patient's blood pressure is on and off elevated at home at the time of my evaluation patient blood pressure was 150/60 which has been the same consistently while her stay in the ED. Therefore at present allowing her permissive hypertension and avoid treating the blood pressure. Daughter was recommended and we will continue to monitor her blood pressure here in the hospital and if it is elevated she will be given some antihypertensive medication.  Consults: neurology  DVT Prophylaxis: subcutaneous Heparin Nutrition: npo  Code Status: full  Family Communication: family was present at bedside, opportunity was given to ask question and all questions were answered satisfactorily at the time of interview. Disposition: Admitted to observation in telemetry unit.  Author: Berle Mull, MD [facility] Pager: [phone] 12/12/2013, 2:56 AM  If 7PM-7AM, please contact night-coverage [email or web address] Password [email or web address]  **Disclaimer: This note may have been dictated with voice recognition software. Similar sounding words can inadvertently be transcribed and this note may contain transcription errors which may not have been corrected upon publication of note.**

## 2013-12-12 NOTE — ED Provider Notes (Signed)
CSN: [id number]     Arrival date & time 12/11/13  2347 History   First MD Initiated Contact with Patient 12/11/13 2359     Chief Complaint  Patient presents with  . Code Stroke    An emergency department physician performed an initial assessment on this suspected stroke patient at 2348. (Consider location/radiation/quality/duration/timing/severity/associated sxs/prior Treatment) HPI  This a 58 year old female with a history of hypertension and hepatitis B he presents as a code stroke. Patient was evaluated by me after being sent to the CT scanner. Patient has had headaches on and off today with associated blurry vision. Patient reportedly at 10 PM had onset of headache with blurry vision. No weakness noted. No history of migraines or similar symptoms.  Patient also reporting dizziness. Daughter reports history of worsening depressive symptoms following her husband's death. She's had 20 pound weight loss recently.  History limited secondary to language barrier.  Past Medical History  Diagnosis Date  . Hypertension   . Hepatitis B    Past Surgical History  Procedure Laterality Date  . Esophagogastroduodenoscopy N/A 09/30/2013    Procedure: ESOPHAGOGASTRODUODENOSCOPY (EGD);  Surgeon: Winfield Cunas., MD;  Location: [facility];  Service: Endoscopy;  Laterality: N/A;  . Eus N/A 11/09/2013    Procedure: ESOPHAGEAL ENDOSCOPIC ULTRASOUND (EUS) RADIAL;  Surgeon: Arta Silence, MD;  Location: [facility];  Service: Endoscopy;  Laterality: N/A;   No family history on file. History  Substance Use Topics  . Smoking status: Never Smoker   . Smokeless tobacco: Not on file  . Alcohol Use: No   OB History   Grav Para Term Preterm Abortions TAB SAB Ect Mult Living                 Review of Systems  Constitutional: Negative for fever.  Eyes: Positive for visual disturbance.  Respiratory: Negative for chest tightness and shortness of breath.   Cardiovascular: Negative for chest pain.   Gastrointestinal: Negative for nausea, vomiting and abdominal pain.  Genitourinary: Negative for dysuria.  Musculoskeletal: Positive for back pain. Negative for neck pain and neck stiffness.  Skin: Negative for rash.  Neurological: Positive for dizziness and headaches. Negative for syncope, speech difficulty, weakness and numbness.  Psychiatric/Behavioral: Negative for confusion.  All other systems reviewed and are negative.     Allergies  Review of patient's allergies indicates no known allergies.  Home Medications   Prior to Admission medications   Not on File   BP 164/74  Pulse 78  Temp(Src) 99.5 F (37.5 C) (Oral)  Resp 16  Ht 5\' 2"  (1.575 m)  Wt 99 lb 6.8 oz (45.1 kg)  BMI 18.18 kg/m2  SpO2 100% Physical Exam  Nursing note and vitals reviewed. Constitutional: She is oriented to person, place, and time. No distress.  HENT:  Head: Normocephalic and atraumatic.  Eyes: Pupils are equal, round, and reactive to light.  Neck: Neck supple.  No meningismus noted  Cardiovascular: Normal rate, regular rhythm and normal heart sounds.   Pulmonary/Chest: Effort normal and breath sounds normal. No respiratory distress. She has no wheezes.  Abdominal: Soft. Bowel sounds are normal. There is no tenderness.  Neurological: She is alert and oriented to person, place, and time.  5 out of 5 strength in all 4 extremities, mild right nasolabial fold flattening, drift noted on the left upper extremity  Skin: Skin is warm and dry.  Psychiatric: She has a normal mood and affect.    ED Course  Procedures (including critical care  time) Labs Review Labs Reviewed  I-STAT CHEM 8, ED - Abnormal; Notable for the following:    Potassium 3.5 (*)    Glucose, Bld 145 (*)    Calcium, Ion 1.10 (*)    All other components within normal limits  PROTIME-INR  APTT  CBC  DIFFERENTIAL  ETHANOL  COMPREHENSIVE METABOLIC PANEL  URINE RAPID DRUG SCREEN (HOSP PERFORMED)  URINALYSIS, ROUTINE W REFLEX  MICROSCOPIC  I-STAT TROPOININ, ED  I-STAT TROPOININ, ED  I-STAT CG4 LACTIC ACID, ED    Imaging Review Ct Head Wo Contrast  12/12/2013   CLINICAL DATA:  Code stroke, unresponsive  EXAM: CT HEAD WITHOUT CONTRAST  TECHNIQUE: Contiguous axial images were obtained from the base of the skull through the vertex without intravenous contrast.  COMPARISON:  None.  FINDINGS: No intraparenchymal hemorrhage, mass, mass effect, or abnormal extra-axial fluid collection. No definite evidence of an acute infarction. No hydrocephalus. Opacified left sphenoid chamber. Visualized paranasal sinuses and mastoid air cells are otherwise predominantly clear.  IMPRESSION: No CT evidence of an acute intracranial abnormality. Recommend MRI if concern for acute ischemia persists. Discussed via telephone with Dr. Dina Rich at 12 a.m. on 12/12/2013.  Opacified left sphenoid chamber may reflect sinusitis in the appropriate clinical setting.   Electronically Signed   By: Carlos Levering M.D.   On: 12/12/2013 00:05     EKG Interpretation EKG independently reviewed by myself: Normal sinus rhythm with rate of 71, no evidence of acute ST elevation, ischemia, or arrhythmia      MDM   Final diagnoses:  Acute intractable headache, unspecified headache type  Blurry vision    Patient presents as a code stroke. Predominant symptoms include headache, blurry vision, and dizziness. NIH stroke scale of 3. She's not a TPA candidate. Neurology is at the bedside. They have recommended admission for stroke rule out and treatment for possible migraine. Patient was given Compazine and Benadryl.  DIscussed with Dr. Posey Pronto.    Merryl Hacker, MD 12/12/13 [phone]

## 2013-12-12 NOTE — Progress Notes (Signed)
UR Completed.  Nuri Larmer Jane [phone] 12/12/2013  

## 2013-12-12 NOTE — Care Management Note (Addendum)
  Page 1 of 1   12/12/2013     2:26:05 PM CARE MANAGEMENT NOTE 12/12/2013  Patient:  Lynn Fleming, Lynn Fleming   Account Number:  [id number]  Date Initiated:  12/12/2013  Documentation initiated by:  Lynn Fleming  Subjective/Objective Assessment:   Patient admitted with TIA. Lives at home with spouse. [place] speaking only.     Action/Plan:   Will follow for discharge needs pending PT/OT evals and physician orders.   Anticipated DC Date:  12/12/2013   Anticipated DC Plan:  HOME/SELF CARE  In-house referral  Financial Counselor      DC Planning Services  CM consult      Choice offered to / List presented to:             Status of service:  Completed, signed off Medicare Important Message given?   (If response is "NO", the following Medicare IM given date fields will be blank) Date Medicare IM given:   Medicare IM given by:   Date Additional Medicare IM given:   Additional Medicare IM given by:    Discharge Disposition:    Per UR Regulation:  Reviewed for med. necessity/level of care/duration of stay  If discussed at [name], dates discussed:    Comments:  12/12/13 [name], MSN, [name] with Lynn Fleming at the [name], who was unable to make an appointment for patient at this time.  Lynn Fleming requested that patient call first thing tomorrow morning 12/13/13, when she will have a better idea of appointment availibility.  This information was shared with patient's daughter, as patient does not speak [name].  Written information regarding the clinic was provided and entered into AVS for discharge.  RN updated.  Message left for financial counselor to notify of plans for discharge today.

## 2013-12-12 NOTE — Consult Note (Addendum)
Referring Physician: ED    Chief Complaint: CODE STROKE, HA, RIGHT FACE WEAKNESS, DIZZINESS,  AND BLURRED VISION  HPI:                                                                                                                                         Lynn Fleming is an 58 y.o. female with a past medical history significant for untreated HTN, hepatitis B, brought in via EMS as a code stroke due to acute onset of the above stated symptoms. Patient doesn't speak English but her daughter is at the bedside and serves as interpreter. She was home not feeling well during the day, weak all over and with bouts of short lasting HA and dizziness. Then, around 1030 pm last night she went to lie down due to severe HA and blurred vision and family decided to call EMS who indicated that patient was complaining of HA had mild right face weakness but was able to follow commands and move upper and lower extremities symmetrically. Daughter denied noticing speech changes or confusion. Upon arrival to the ED she had NIHSS 4 and urgent CT brain showed no acute intracranial abnormality. Of note, daughter expressed that her mother " never feels well, is the kind of person that never goes out, and is not interested in socializing, mainly in the past few months". Husband died approximately 1 year ago. Date last known well: 12/11/13 Time last known well: 10:30 pm tPA Given: no, mild deficits. NIHSS: 5   Past Medical History  Diagnosis Date  . Hypertension   . Hepatitis B     Past Surgical History  Procedure Laterality Date  . Esophagogastroduodenoscopy N/A 09/30/2013    Procedure: ESOPHAGOGASTRODUODENOSCOPY (EGD);  Surgeon: Winfield Cunas., MD;  Location: [facility];  Service: Endoscopy;  Laterality: N/A;  . Eus N/A 11/09/2013    Procedure: ESOPHAGEAL ENDOSCOPIC ULTRASOUND (EUS) RADIAL;  Surgeon: Arta Silence, MD;  Location: [facility];  Service: Endoscopy;  Laterality: N/A;    No family history  on file. Social History:  reports that she has never smoked. She does not have any smokeless tobacco history on file. She reports that she does not drink alcohol or use illicit drugs.  Allergies: No Known Allergies  Medications:  I have reviewed the patient's current medications.  ROS:                                                                                                                                       History obtained from the patient, daughter, chart review.  General ROS: negative for - chills, fatigue, fever, or night sweats. Lost 20 lbs recently with no change in diet. Psychological ROS: negative for - behavioral disorder, hallucinations, memory difficulties, or suicidal ideation Ophthalmic ROS: negative for - double vision, eye pain or loss of vision ENT ROS: negative for - epistaxis, nasal discharge, oral lesions, sore throat, tinnitus or vertigo Allergy and Immunology ROS: negative for - hives or itchy/watery eyes Hematological and Lymphatic ROS: negative for - bleeding problems, bruising or swollen lymph nodes Endocrine ROS: negative for - galactorrhea, hair pattern changes, polydipsia/polyuria or temperature intolerance Respiratory ROS: negative for - cough, hemoptysis, shortness of breath or wheezing Cardiovascular ROS: negative for - chest pain, dyspnea on exertion, edema or irregular heartbeat Gastrointestinal ROS: negative for - abdominal pain, diarrhea, hematemesis, nausea/vomiting or stool incontinence Genito-Urinary ROS: negative for - dysuria, hematuria, incontinence or urinary frequency/urgency Musculoskeletal ROS: negative for - joint swelling or muscular weakness Neurological ROS: as noted in HPI Dermatological ROS: negative for rash and skin lesion changes  Physical exam: no apparent distress. Blood pressure 164/74, pulse 78,  temperature 99.5 F (37.5 C), temperature source Oral, resp. rate 16, height 5\' 2"  (1.575 m), weight 45.1 kg (99 lb 6.8 oz), SpO2 100.00%. Head: normocephalic. Neck: supple, no bruits, no JVD. 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 Cardiac: no murmurs. Lungs: clear. Abdomen: soft, no tender, no mass. Extremities: no edema. Neurologic Examination:                                                                                                      General: Mental Status: Alert, oriented, thought content appropriate.  Speech fluent without evidence of aphasia.  Able to follow 3 step commands without difficulty. Cranial Nerves: II: Discs flat bilaterally; Visual fields grossly normal, pupils equal, round, reactive to light and accommodation III,IV, VI: ptosis not present, extra-ocular motions intact bilaterally V,VII: smile asymmetric due to mild right nasolabial fold narrowing , facial light touch sensation normal bilaterally VIII: hearing normal bilaterally IX,X: gag reflex present XI: bilateral shoulder shrug XII: midline tongue extension without atrophy or fasciculations Motor: Mild drift left arm Tone and bulk:normal tone throughout; no atrophy noted Sensory: Pinprick  and light touch slightly diminished on the right arm Deep Tendon Reflexes:  Right: Upper Extremity   Left: Upper extremity   biceps (C-5 to C-6) 2/4   biceps (C-5 to C-6) 2/4 tricep (C7) 2/4    triceps (C7) 2/4 Brachioradialis (C6) 2/4  Brachioradialis (C6) 2/4  Lower Extremity Lower Extremity  quadriceps (L-2 to L-4) 2/4   quadriceps (L-2 to L-4) 2/4 Achilles (S1) 2/4   Achilles (S1) 2/4  Plantars: Right: downgoing   Left: downgoing Cerebellar: normal finger-to-nose,  normal heel-to-shin test Gait:  No tested  Results for orders placed during the hospital encounter of 12/11/13 (from the past 48 hour(s))  CBC     Status: None   Collection Time    12/11/13 11:50 PM      Result Value Ref Range    WBC 6.0  4.0 - 10.5 K/uL   RBC 4.15  3.87 - 5.11 MIL/uL   Hemoglobin 13.5  12.0 - 15.0 g/dL   HCT 38.7  36.0 - 46.0 %   MCV 93.3  78.0 - 100.0 fL   MCH 32.5  26.0 - 34.0 pg   MCHC 34.9  30.0 - 36.0 g/dL   RDW 12.2  11.5 - 15.5 %   Platelets 222  150 - 400 K/uL  DIFFERENTIAL     Status: None   Collection Time    12/11/13 11:50 PM      Result Value Ref Range   Neutrophils Relative % 48  43 - 77 %   Neutro Abs 2.9  1.7 - 7.7 K/uL   Lymphocytes Relative 45  12 - 46 %   Lymphs Abs 2.7  0.7 - 4.0 K/uL   Monocytes Relative 6  3 - 12 %   Monocytes Absolute 0.4  0.1 - 1.0 K/uL   Eosinophils Relative 1  0 - 5 %   Eosinophils Absolute 0.1  0.0 - 0.7 K/uL   Basophils Relative 0  0 - 1 %   Basophils Absolute 0.0  0.0 - 0.1 K/uL  I-STAT TROPOININ, ED     Status: None   Collection Time    12/11/13 11:56 PM      Result Value Ref Range   Troponin i, poc 0.00  0.00 - 0.08 ng/mL   Comment 3            Comment: Due to the release kinetics of cTnI,     a negative result within the first hours     of the onset of symptoms does not rule out     myocardial infarction with certainty.     If myocardial infarction is still suspected,     repeat the test at appropriate intervals.  I-STAT CHEM 8, ED     Status: Abnormal   Collection Time    12/11/13 11:57 PM      Result Value Ref Range   Sodium 143  137 - 147 mEq/L   Potassium 3.5 (*) 3.7 - 5.3 mEq/L   Chloride 108  96 - 112 mEq/L   BUN 10  6 - 23 mg/dL   Creatinine, Ser 0.50  0.50 - 1.10 mg/dL   Glucose, Bld 145 (*) 70 - 99 mg/dL   Calcium, Ion 1.10 (*) 1.13 - 1.30 mmol/L   TCO2 25  0 - 100 mmol/L   Hemoglobin 13.6  12.0 - 15.0 g/dL   HCT 40.0  36.0 - 46.0 %  I-STAT CG4 LACTIC ACID, ED     Status: None  Collection Time    12/11/13 11:58 PM      Result Value Ref Range   Lactic Acid, Venous 2.07  0.5 - 2.2 mmol/L   Ct Head Wo Contrast  12/12/2013   CLINICAL DATA:  Code stroke, unresponsive  EXAM: CT HEAD WITHOUT CONTRAST  TECHNIQUE:  Contiguous axial images were obtained from the base of the skull through the vertex without intravenous contrast.  COMPARISON:  None.  FINDINGS: No intraparenchymal hemorrhage, mass, mass effect, or abnormal extra-axial fluid collection. No definite evidence of an acute infarction. No hydrocephalus. Opacified left sphenoid chamber. Visualized paranasal sinuses and mastoid air cells are otherwise predominantly clear.  IMPRESSION: No CT evidence of an acute intracranial abnormality. Recommend MRI if concern for acute ischemia persists. Discussed via telephone with Dr. Dina Rich at 12 a.m. on 12/12/2013.  Opacified left sphenoid chamber may reflect sinusitis in the appropriate clinical setting.   Electronically Signed   By: Carlos Levering M.D.   On: 12/12/2013 00:05    Assessment: 58 y.o. female with untreated HTN, brought as a code stroke due to HA, dizziness, blurred vision, and mild right lower face weakness. NIHSS 5, CT brain negative for acute abnormality. Mild deficits and thus IV tpa was not administered. Concern about ongoing depression after passing of her husband 1 year ago. Will suggest MRI brain and further neuro testing only if MRI positive for stroke. Treat HA with Reglan and Depakote. Will follow up.  Dorian Pod, MD [facility] [phone]  12/12/2013, 12:17 AM

## 2013-12-12 NOTE — Progress Notes (Signed)
Pt doing well. Pt and daughter given D/C instructions with stroke education handouts, verbal understanding of teaching was provided by daughter. Pt's IV was removed prior to D/C. Pt D/C'd home via walking per MD order. Pt is stable @ D/C and has no other needs at this time. Holli Humbles, RN

## 2013-12-12 NOTE — Discharge Summary (Signed)
Physician Discharge Summary  Lynn Fleming YDX:[id number] DOB: January 08, 1956 DOA: 12/11/2013  PCP: No PCP Per Patient  Admit date: 12/11/2013 Discharge date: 12/12/2013  Time spent: >45 minutes  Recommendations for Outpatient Follow-up:  1. F/u BP readings   Discharge Condition: stable Diet recommendation: heart healthy  Discharge Diagnoses:  Principal Problem:   TIA (transient ischemic attack) Active Problems:   Headache   History of present illness:  Lynn Fleming is a 58 y.o. female with Past medical history of hypertension, gastric polyp, chronic gastritis, weight loss.  The patient presented with complaints of generalized fatigue with headache and dizziness. At around 10:30 PM the patient lie down due to severe headache and blurred vision and she has been having similar symptoms on and off since last one week progressively worsening. Throughout the day as per the daughter her blood pressure has been elevated.  Patient complains of on and off dizziness 3-4 times a week and daughter mentions that they check her blood pressure at that point and most of the time found her blood pressure elevated.   Hospital Course:  Dizziness, headache, possible HTN She is asymptomatic now. Further discussion with her daughter reveals that the pateint is depressed and is worried about her old mother back home in her country whom she is not able to take care of. - per patient's daughter, the patient does not have a formal diagnosis of HTN and has never been on any medications for it. -  I have asked her to have mother check her BP at home twice a day and make a log of it. We will be making her an appt with a PCP where she can take her BP readings  Procedures:  none  Consultations:  Neurology  Discharge Exam: Filed Weights   12/11/13 2359 12/12/13 0300  Weight: 45.1 kg (99 lb 6.8 oz) 54.4 kg (119 lb 14.9 oz)   Filed Vitals:   12/12/13 1007  BP: 136/89  Pulse: 79  Temp: 98.8 F  (37.1 C)  Resp: 18    General: AAO x 3, no distress Cardiovascular: RRR, no murmurs  Respiratory: clear to auscultation bilaterally GI: soft, non-tender, non-distended, bowel sound positive  Discharge Instructions You were cared for by a hospitalist during your hospital stay. If you have any questions about your discharge medications or the care you received while you were in the hospital after you are discharged, you can call the unit and asked to speak with the hospitalist on call if the hospitalist that took care of you is not available. Once you are discharged, your primary care physician will handle any further medical issues. Please note that NO REFILLS for any discharge medications will be authorized once you are discharged, as it is imperative that you return to your primary care physician (or establish a relationship with a primary care physician if you do not have one) for your aftercare needs so that they can reassess your need for medications and monitor your lab values.     Medication List    Notice   You have not been prescribed any medications.     No Known Allergies    The results of significant diagnostics from this hospitalization (including imaging, microbiology, ancillary and laboratory) are listed below for reference.    Significant Diagnostic Studies: Ct Head Wo Contrast  12/12/2013   CLINICAL DATA:  Code stroke, unresponsive  EXAM: CT HEAD WITHOUT CONTRAST  TECHNIQUE: Contiguous axial images were obtained from the base of  the skull through the vertex without intravenous contrast.  COMPARISON:  None.  FINDINGS: No intraparenchymal hemorrhage, mass, mass effect, or abnormal extra-axial fluid collection. No definite evidence of an acute infarction. No hydrocephalus. Opacified left sphenoid chamber. Visualized paranasal sinuses and mastoid air cells are otherwise predominantly clear.  IMPRESSION: No CT evidence of an acute intracranial abnormality. Recommend MRI if  concern for acute ischemia persists. Discussed via telephone with Dr. Dina Rich at 12 a.m. on 12/12/2013.  Opacified left sphenoid chamber may reflect sinusitis in the appropriate clinical setting.   Electronically Signed   By: Carlos Levering M.D.   On: 12/12/2013 00:05   Mr Brain Wo Contrast  12/12/2013   CLINICAL DATA:  Stroke.  Headache and dizziness  EXAM: MRI HEAD WITHOUT CONTRAST  TECHNIQUE: Multiplanar, multiecho pulse sequences of the brain and surrounding structures were obtained without intravenous contrast.  COMPARISON:  CT head 12/11/2013  FINDINGS: Negative for acute infarct.  Ventricle size is normal. Cerebral volume is normal. Craniocervical junction is normal. Pituitary normal in size.  Negative for chronic ischemia. No demyelinating disease. Negative for hemorrhage or mass lesion.  Mucosal thickening and secretions in the left sphenoid sinus. These are hyperintense on T1 and hypo intense on T2. This pattern can be seen with fungal sinusitis.  IMPRESSION: Negative MRI head  Left sphenoid sinus is filled with secretions.   Electronically Signed   By: Franchot Gallo M.D.   On: 12/12/2013 07:55    Microbiology: No results found for this or any previous visit (from the past 240 hour(s)).   Labs: Basic Metabolic Panel:  Recent Labs Lab 12/11/13 2350 12/11/13 2357 12/12/13 0755  NA 141 143 147  K 3.6* 3.5* 3.7  CL 105 108 108  CO2 23  --  25  GLUCOSE 142* 145* 118*  BUN 10 10 7   CREATININE 0.51 0.50 0.44*  CALCIUM 9.8  --  9.3   Liver Function Tests:  Recent Labs Lab 12/11/13 2350 12/12/13 0755  AST 15 14  ALT 9 8  ALKPHOS 102 92  BILITOT 0.3 0.5  PROT 8.0 8.0  ALBUMIN 4.1 4.2   No results found for this basename: LIPASE, AMYLASE,  in the last 168 hours No results found for this basename: AMMONIA,  in the last 168 hours CBC:  Recent Labs Lab 12/11/13 2350 12/11/13 2357 12/12/13 0755  WBC 6.0  --  5.2  NEUTROABS 2.9  --  2.6  HGB 13.5 13.6 13.7  HCT 38.7  40.0 41.4  MCV 93.3  --  96.7  PLT 222  --  242   Cardiac Enzymes: No results found for this basename: CKTOTAL, CKMB, CKMBINDEX, TROPONINI,  in the last 168 hours BNP: BNP (last 3 results) No results found for this basename: PROBNP,  in the last 8760 hours CBG: No results found for this basename: GLUCAP,  in the last 168 hours     Signed:  Debbe Odea, MD [facility] 12/12/2013, 11:46 AM

## 2013-12-12 NOTE — Code Documentation (Signed)
39 yr asian female brought in by [facility] for HA, dizziness, & facial droop.  Per report pt has had 2 episodes of similar symptoms earlier in the day that resolved after several minutes.  This episode began over 1 hr ago.  Pt has been feeling poorly for sometime & has lost over 25 lbs in the last year & recently has quit leaving the house.  The pt has been recently worked up for a nodule on her left anterior neck.  NIH 5 for inability to state her age, mild facial droop, Lt arm drift, and mild sensory deficit.  See doc flowsheets for code stroke times.

## 2013-12-12 NOTE — ED Notes (Signed)
Pt arrives to ED via EMS from home. Daughter states that pt had headache x2 which resolved on their own andf a HA x1 hr that has not resolved. Also c/o blurred vision and dizziness. Pt has lump to throat which she is supposed to get an MRI for. Daughter also states that pt has lost 20 lbs recently with no change in diet.

## 2013-12-15 ENCOUNTER — Other Ambulatory Visit: Payer: Self-pay | Admitting: Internal Medicine

## 2013-12-15 DIAGNOSIS — R221 Localized swelling, mass and lump, neck: Principal | ICD-10-CM

## 2013-12-15 DIAGNOSIS — R220 Localized swelling, mass and lump, head: Secondary | ICD-10-CM

## 2014-01-19 ENCOUNTER — Other Ambulatory Visit: Payer: Self-pay | Admitting: Family Medicine

## 2014-01-19 DIAGNOSIS — E049 Nontoxic goiter, unspecified: Secondary | ICD-10-CM

## 2014-01-20 ENCOUNTER — Ambulatory Visit
Admission: RE | Admit: 2014-01-20 | Discharge: 2014-01-20 | Disposition: A | Discharge status: Home / Self Care | Payer: No Typology Code available for payment source | Source: Ambulatory Visit | Attending: Family Medicine | Admitting: Family Medicine

## 2014-01-20 DIAGNOSIS — E049 Nontoxic goiter, unspecified: Secondary | ICD-10-CM

## 2014-02-04 ENCOUNTER — Ambulatory Visit (INDEPENDENT_AMBULATORY_CARE_PROVIDER_SITE_OTHER): Payer: Self-pay | Admitting: Physician Assistant

## 2014-02-04 VITALS — BP 160/88 | HR 70 | Temp 98.4°F | Resp 19 | Ht 61.5 in | Wt 111.2 lb

## 2014-02-04 DIAGNOSIS — I10 Essential (primary) hypertension: Secondary | ICD-10-CM

## 2014-02-04 DIAGNOSIS — G4700 Insomnia, unspecified: Secondary | ICD-10-CM

## 2014-02-04 DIAGNOSIS — R5382 Chronic fatigue, unspecified: Secondary | ICD-10-CM

## 2014-02-04 DIAGNOSIS — F329 Major depressive disorder, single episode, unspecified: Secondary | ICD-10-CM

## 2014-02-04 DIAGNOSIS — F32A Depression, unspecified: Secondary | ICD-10-CM

## 2014-02-04 DIAGNOSIS — M62830 Muscle spasm of back: Secondary | ICD-10-CM

## 2014-02-04 LAB — TSH: TSH: 0.624 u[IU]/mL (ref 0.350–4.500)

## 2014-02-04 LAB — VITAMIN B12: VITAMIN B 12: 463 pg/mL (ref 211–911)

## 2014-02-04 MED ORDER — CYCLOBENZAPRINE HCL 5 MG PO TABS: 5.0000 mg | ORAL_TABLET | Freq: Three times a day (TID) | ORAL | Status: DC | PRN

## 2014-02-04 MED ORDER — CITALOPRAM HYDROBROMIDE 20 MG PO TABS: 10.0000 mg | ORAL_TABLET | Freq: Every day | ORAL | Status: DC

## 2014-02-04 MED ORDER — LISINOPRIL 10 MG PO TABS: 10.0000 mg | ORAL_TABLET | Freq: Every day | ORAL | Status: DC

## 2014-02-04 NOTE — Progress Notes (Signed)
Subjective:    Patient ID: Lynn Fleming, female    DOB: [birth year], 58 y.o.   MRN: [id number]  HPI  58 yof with PMH recently diagnosed HTN presents today with one year hx of weakness, fatigue, insomnia, abd pain, and anxiety.   She is from [place] and moved here with her husband and children in 2007. She does not speak any English though all her family does. The above mentioned conditions have all been ongoing over the past year since approx the time her husband suffered an MI.   She has been worked up numerous times for all of these complaints. She had a normal EGD and esophageal [place] in 11/2103. She went to the ED that same month for weakness and HAs. She had numerous labs drawn which were WNL along with a normal head CT and head MRI.   Her daughter is here today speaking for her. She states that the pt has never really had a lot of energy. She has not held a job since moving to the [place]. After her husband suffered the MI last year she seriously declined. She rarely goes outside, lies in bed most of the day, and doesn't eat nearly as much as she used to. Daughter states that sometimes the pt feels so fatigued that her husband with have to bathe her. Daughter states that the entire family is worried about her and that they are frustrated with her medical care so far as nobody has diagnosed her with anything to explain the sx. Her family would like her to visit [place] as the pts mother, siblings, and two sons with their own families are living there. Unfortunately, she feels that she is too fatigued to go.   When she was in the ED in 8/15 her BP was elevated in 160s range. She was told to check her BP at home upon discharge. It has been running in the 140/80-90s at home, and is 160/88 today.   She has been having left sided low back pain for past month, seen by family MD 2 weeks ago - prescribed nsaids which upset her stomach so stopped taking them.    Review of Systems No fever, chills, N/V,  diarrhea.     Objective:   Physical Exam  Constitutional: She appears well-developed and well-nourished. She appears lethargic.  Non-toxic appearance. She has a sickly appearance. She does not appear ill.  BP 160/88  Pulse 70  Temp(Src) 98.4 F (36.9 C) (Oral)  Resp 19  Ht 5' 1.5" (1.562 m)  Wt 111 lb 3.2 oz (50.44 kg)  BMI 20.67 kg/m2  SpO2 98%   HENT:  Head: Normocephalic and atraumatic.  Right Ear: Hearing, tympanic membrane, external ear and ear canal normal.  Left Ear: Hearing, tympanic membrane, external ear and ear canal normal.  Mouth/Throat: Uvula is midline and oropharynx is clear and moist. No oropharyngeal exudate, posterior oropharyngeal edema or posterior oropharyngeal erythema.  Eyes: Conjunctivae and EOM are normal. Pupils are equal, round, and reactive to light.  Neck: No mass and no thyromegaly present.  Cardiovascular: Normal rate, regular rhythm and normal heart sounds.  Exam reveals no gallop.   No murmur heard. Pulmonary/Chest: Effort normal and breath sounds normal. She has no decreased breath sounds. She has no wheezes. She has no rhonchi. She has no rales.  Musculoskeletal:       Lumbar back: She exhibits tenderness and spasm.  Muscle spasm noted left lower back paraspinal muscles. TTP. Normal sensation.  Neurological: She has normal strength. She appears lethargic. She is not disoriented. No cranial nerve deficit or sensory deficit.  Psychiatric: Judgment and thought content normal. She is slowed. Cognition and memory are normal. She exhibits a depressed mood.  Depressed/uninterested appearing seated on edge of bed. Speaking through daughter. Not agitated, not fidgeting.    PHQ 9 score = 22     Assessment & Plan:  58 yof with PMH recently diagnosed HTN presents today with one year hx of weakness, fatigue, insomnia, abd pain, and anxiety.   Depression - Plan: citalopram (CELEXA) 20 MG tablet --Chronic sx over past year most likely secondary to  mood --Scored 22 on PHQ9 today --Long conversation regarding depression as cause of sx with daughter/pt, they are both in agreement.  --Celexa 10mg  qd to start --Instructed it will take several weeks to see positive effect --She only speaks [place]. Therefore would like to have f/u with Dr Marin Comment in one month to assess therapy --Encouraged exercise and outdoors.  Chronic fatigue - Plan: TSH, Vitamin B12, Vitamin D 1,25 dihydroxy --Has been thoroughly worked up in the past with normal labs, normal head CT, normal head MRI 2 months ago.  --TSH, B12, D today as could not find record of those  Insomnia --Likely secondary to depression  Essential hypertension - Plan: lisinopril (PRINIVIL,ZESTRIL) 10 MG tablet --Lisinopril 10 mg qd --No diuretic at this time as hx of low-normal K  Muscle spasm of back - Plan: cyclobenzaprine (FLEXERIL) 5 MG tablet --Likely due to lying in bed in one position lately past couple months --Flexeril, heat, massage  Julieta Gutting, PA-C Physician Assistant-Certified [facility]  02/04/2014 2:59 PM

## 2014-02-04 NOTE — Patient Instructions (Addendum)
I think that your symptoms of fatigue, insomnia, lack of appetite, and abd pain are most likely due to depression.  We have started you an SSRI for depression, take 1/2 pill which is 10 mg celexa every morning. This medication takes several weeks to take effect so please realize that it'll take this long for you to start feeling better. We have started you on lisinopril for your BP, please take this pill once daily.  You have a muscle spasm in your lower back. Please take the flexeril every 8 hours as needed for the pain. This medication will make her more sleepy.  Please follow up with Dr. Marin Comment in the clinic in one month, she is a great MD who speaks [place].  Please be sure to get outside for a short walk every day as this will help with your back discomfort.  We have drawn some labs today for the fatigue and will contact you with the results.

## 2014-02-05 NOTE — Progress Notes (Signed)
I was directly involved with the patient's care and agree with the physical, diagnosis and treatment plan.  

## 2014-02-08 LAB — VITAMIN D 1,25 DIHYDROXY
VITAMIN D 1, 25 (OH) TOTAL: 126 pg/mL — AB (ref 18–72)
Vitamin D2 1, 25 (OH)2: <8 pg/mL
Vitamin D3 1, 25 (OH)2: 126 pg/mL

## 2014-07-06 ENCOUNTER — Ambulatory Visit (INDEPENDENT_AMBULATORY_CARE_PROVIDER_SITE_OTHER): Payer: Self-pay | Admitting: Physician Assistant

## 2014-07-06 VITALS — BP 130/64 | HR 83 | Temp 97.9°F | Resp 18 | Ht 61.5 in | Wt 122.0 lb

## 2014-07-06 DIAGNOSIS — J019 Acute sinusitis, unspecified: Secondary | ICD-10-CM

## 2014-07-06 MED ORDER — IPRATROPIUM BROMIDE 0.03 % NA SOLN: 2.0000 | Freq: Two times a day (BID) | NASAL | Status: DC

## 2014-07-06 MED ORDER — MAGIC MOUTHWASH W/LIDOCAINE: 10.0000 mL | ORAL | Status: DC | PRN

## 2014-07-06 MED ORDER — AMOXICILLIN-POT CLAVULANATE 875-125 MG PO TABS: 1.0000 | ORAL_TABLET | Freq: Two times a day (BID) | ORAL | Status: AC

## 2014-07-06 NOTE — Progress Notes (Signed)
Subjective:    Patient ID: Lynn Fleming, female    DOB: 13-May-1955, 59 y.o.   MRN: [id number]  HPI  This is a 59 year old vietnamese-speaking female presenting with her 61 speaking daughter. She is presenting with sore throat, nasal congestion and cough x 2 weeks. The cough is productive of yellow mucous. She states she feels she is coughing because her throat is itchy and mucous is draining down her throat. She is unable to sleep at night d/t the cough. She has moderate sinus pressure that comes and goes. She has felt intermittently hot and cold but never checked temperature. Denies otalgia, SOB or wheezing. She has tried claritin and not helping. She does not have allergies. She [place] and has never had allergies or respiratory infections. No history of asthma and not a smoker. No sick contacts.   Review of Systems  Constitutional: Positive for chills. Negative for fever.  HENT: Positive for congestion, sinus pressure and sore throat. Negative for ear pain.   Eyes: Negative for redness.  Respiratory: Positive for cough. Negative for shortness of breath and wheezing.   Gastrointestinal: Negative for nausea and vomiting.  Skin: Negative for rash.  Allergic/Immunologic: Negative for environmental allergies.  Neurological: Negative for dizziness.  Hematological: Positive for adenopathy.  Psychiatric/Behavioral: Positive for sleep disturbance.   Patient Active Problem List   Diagnosis Date Noted  . Headache 12/12/2013  . TIA (transient ischemic attack) 12/12/2013   Prior to Admission medications   Medication Sig Start Date End Date Taking? Authorizing Provider  aspirin 81 MG chewable tablet Chew 1 tablet (81 mg total) by mouth daily. 12/12/13  Yes Debbe Odea, MD  lisinopril (PRINIVIL,ZESTRIL) 10 MG tablet Take 1 tablet (10 mg total) by mouth daily. 02/04/14  Yes Merlinda Frederick McVeigh, PA          No Known Allergies  Patient's social  and family history were reviewed.     Objective:   Physical Exam  Constitutional: She is oriented to person, place, and time. She appears well-developed and well-nourished. No distress.  HENT:  Head: Normocephalic and atraumatic.  Right Ear: Hearing, external ear and ear canal normal. Tympanic membrane is retracted.  Left Ear: Hearing, external ear and ear canal normal. Tympanic membrane is retracted.  Nose: Mucosal edema present. Right sinus exhibits no frontal sinus tenderness. Left sinus exhibits no frontal sinus tenderness.  Mouth/Throat: Mucous membranes are normal. Posterior oropharyngeal erythema present.  Mild bilateral maxillary tenderness  Eyes: Conjunctivae and lids are normal. Right eye exhibits no discharge. Left eye exhibits no discharge. No scleral icterus.  Cardiovascular: Normal rate, regular rhythm, normal heart sounds, intact distal pulses and normal pulses.   No murmur heard. Pulmonary/Chest: Effort normal and breath sounds normal. No respiratory distress. She has no wheezes. She has no rhonchi. She has no rales.  Musculoskeletal: Normal range of motion.  Lymphadenopathy:       Head (right side): No submental, no submandibular and no tonsillar adenopathy present.       Head (left side): No submental, no submandibular and no tonsillar adenopathy present.    She has no cervical adenopathy.  Neurological: She is alert and oriented to person, place, and time.  Skin: Skin is warm, dry and intact. No lesion and no rash noted.  Psychiatric: She has a normal mood and affect. Her speech is normal and behavior is normal. Thought content normal.   BP 130/64 mmHg  Pulse 83  Temp(Src) 97.9 F (36.6 C) (Oral)  Resp 18  Ht 5' 1.5" (1.562 m)  Wt 122 lb (55.339 kg)  BMI 22.68 kg/m2  SpO2 99%     Assessment & Plan:  1. Acute sinusitis, recurrence not specified, unspecified location Will treat with augmentin, magic mouthwash and atrovent nasal spray. She will stop claritin  since it is not helping and she does not have a history of allergies. She will return in 7-10 days if symptoms are not improving.  - amoxicillin-clavulanate (AUGMENTIN) 875-125 MG per tablet; Take 1 tablet by mouth 2 (two) times daily.  Dispense: 20 tablet; Refill: 0 - Alum & Mag Hydroxide-Simeth (MAGIC MOUTHWASH W/LIDOCAINE) SOLN; Take 10 mLs by mouth every 2 (two) hours as needed for mouth pain.  Dispense: 360 mL; Refill: 0 - ipratropium (ATROVENT) 0.03 % nasal spray; Place 2 sprays into both nostrils 2 (two) times daily.  Dispense: 30 mL; Refill: 0   Nicole V. Drenda Freeze, MHS [facility]  07/06/2014

## 2014-07-06 NOTE — Patient Instructions (Signed)
Gargle mouthwash every 2-3 hours as needed. No not swallow. Take antibiotic until finished. Use nasal spray twice a day. You can stop the claritin if it is not working. Return in 7-10 days if symptoms are not improving.

## 2014-11-06 ENCOUNTER — Ambulatory Visit (INDEPENDENT_AMBULATORY_CARE_PROVIDER_SITE_OTHER): Payer: Self-pay

## 2014-11-06 ENCOUNTER — Ambulatory Visit (INDEPENDENT_AMBULATORY_CARE_PROVIDER_SITE_OTHER): Payer: Self-pay | Admitting: Family Medicine

## 2014-11-06 VITALS — BP 128/80 | HR 85 | Temp 98.4°F | Resp 16 | Ht 61.0 in | Wt 134.4 lb

## 2014-11-06 DIAGNOSIS — G8929 Other chronic pain: Secondary | ICD-10-CM

## 2014-11-06 DIAGNOSIS — M545 Low back pain: Secondary | ICD-10-CM

## 2014-11-06 DIAGNOSIS — Z760 Encounter for issue of repeat prescription: Secondary | ICD-10-CM

## 2014-11-06 LAB — COMPLETE METABOLIC PANEL WITH GFR
ALBUMIN: 4.7 g/dL (ref 3.6–5.1)
ALT: 6 U/L (ref 6–29)
AST: 12 U/L (ref 10–35)
Alkaline Phosphatase: 76 U/L (ref 33–130)
BILIRUBIN TOTAL: 0.3 mg/dL (ref 0.2–1.2)
BUN: 20 mg/dL (ref 7–25)
CALCIUM: 9.7 mg/dL (ref 8.6–10.4)
CO2: 27 mmol/L (ref 20–31)
CREATININE: 0.73 mg/dL (ref 0.50–0.99)
Chloride: 104 mmol/L (ref 98–110)
GFR, Est African American: >89 mL/min (ref >=60)
GFR, Est Non African American: 89 mL/min (ref >=60)
Glucose, Bld: 103 mg/dL — ABNORMAL HIGH (ref 65–99)
Potassium: 4 mmol/L (ref 3.5–5.3)
Sodium: 140 mmol/L (ref 135–146)
Total Protein: 7.6 g/dL (ref 6.1–8.1)

## 2014-11-06 MED ORDER — CYCLOBENZAPRINE HCL 10 MG PO TABS: 10.0000 mg | ORAL_TABLET | Freq: Three times a day (TID) | ORAL | Status: DC | PRN

## 2014-11-06 MED ORDER — TRAMADOL-ACETAMINOPHEN 37.5-325 MG PO TABS: 1.0000 | ORAL_TABLET | Freq: Three times a day (TID) | ORAL | Status: DC | PRN

## 2014-11-06 MED ORDER — CITALOPRAM HYDROBROMIDE 20 MG PO TABS: 20.0000 mg | ORAL_TABLET | Freq: Every day | ORAL | Status: DC

## 2014-11-06 MED ORDER — LISINOPRIL 10 MG PO TABS: 10.0000 mg | ORAL_TABLET | Freq: Every day | ORAL | Status: DC

## 2014-11-06 NOTE — Progress Notes (Signed)
11/08/2014 at 7:28 PM  [place] / DOB: 1955-07-13 / MRN: [id number]  The patient has Headache and TIA (transient ischemic attack) on her problem list.  SUBJECTIVE  Chief complaint: Back Pain  Lynn Fleming is a 59 y.o. female with a pertinent medical history of low back pain complaining of stable "dull" lower back pain that started 5 days ago. Associated symptoms include no other symptoms, and she denies weakness, numbness, dysuria.Treatments tried thus far include none with poor relief. She denies fever, nausea, dysuria, frequency and urgency.   She would like her medications refilled today and is agreeable to lab work.    She  has a past medical history of Hypertension and Hepatitis B.    Medications reviewed and updated by myself where necessary, and exist elsewhere in the encounter.   Lynn Fleming has No Known Allergies. She  reports that she has never smoked. She has never used smokeless tobacco. She reports that she does not drink alcohol or use illicit drugs. She  reports that she does not currently engage in sexual activity. The patient  has past surgical history that includes Esophagogastroduodenoscopy (N/A, 09/30/2013) and EUS (N/A, 11/09/2013).  Her family history is not on file.  Review of Systems  Constitutional: Positive for fever.  Respiratory: Negative for cough.   Cardiovascular: Negative for chest pain.  Genitourinary: Negative for dysuria.  Musculoskeletal: Positive for myalgias and back pain.  Skin: Negative for rash.  Neurological: Negative for dizziness and headaches.  Psychiatric/Behavioral: Negative for depression.    OBJECTIVE  Her  height is 5\' 1"  (1.549 m) and weight is 134 lb 6.4 oz (60.963 kg). Her oral temperature is 98.4 F (36.9 C). Her blood pressure is 128/80 and her pulse is 85. Her respiration is 16 and oxygen saturation is 95%.  The patient's body mass index is 25.41 kg/(m^2).  Physical Exam  Vitals reviewed. Constitutional: She is  oriented to person, place, and time. She appears well-developed and well-nourished. No distress.  Cardiovascular: Normal rate and regular rhythm.   Respiratory: Effort normal and breath sounds normal.  GI: Soft. Bowel sounds are normal.  Musculoskeletal: Normal range of motion.  Neurological: She is alert and oriented to person, place, and time. No cranial nerve deficit.  Skin: Skin is warm and dry. She is not diaphoretic.  Psychiatric: She has a normal mood and affect.    No results found for this or any previous visit (from the past 24 hour(s)).  [facility] reading (PRIMARY) by  Dr. Brigitte Pulse: Lumbar scoliosis with osteophytes a multiple levels.  Mineralization appears normal.   ASSESSMENT & PLAN  Lynn Fleming was seen today for back pain.  Diagnoses and all orders for this visit:  Chronic low back pain: Given radiograph patient has been suffering with back pain for a long time and this  Orders: -     DG Lumbar Spine Complete; Future -     cyclobenzaprine (FLEXERIL) 10 MG tablet; Take 1 tablet (10 mg total) by mouth 3 (three) times daily as needed for muscle spasms. -     traMADol-acetaminophen (ULTRACET) 37.5-325 MG per tablet; Take 1-2 tablets by mouth every 8 (eight) hours as needed. -     Ambulatory referral to Physical Therapy  Medication refill Orders: -     Cancel: CBC -     COMPLETE METABOLIC PANEL WITH GFR -     citalopram (CELEXA) 20 MG tablet; Take 1 tablet (20 mg total) by mouth daily. -  lisinopril (PRINIVIL,ZESTRIL) 10 MG tablet; Take 1 tablet (10 mg total) by mouth daily.      The patient was advised to call or come back to clinic if she does not see an improvement in symptoms, or worsens with the above plan.   Philis Fendt, MHS, PA-C [facility] 11/08/2014 7:28 PM

## 2014-11-08 ENCOUNTER — Telehealth: Payer: Self-pay

## 2014-11-08 NOTE — Telephone Encounter (Signed)
[facility] called and stated they did not receive a lavender tube to perform the CBC. FYI Philis Fendt

## 2014-11-10 ENCOUNTER — Encounter: Payer: Self-pay | Admitting: Family Medicine

## 2014-11-10 NOTE — Progress Notes (Signed)
Patient ID: Lynn Fleming, female   DOB: 02/12/1956, 59 y.o.   MRN: [id number] Reviewed documentation and xray and agree w/ assessment and plan. Delman Cheadle, MD MPH

## 2015-02-09 ENCOUNTER — Other Ambulatory Visit: Payer: Self-pay | Admitting: Physician Assistant

## 2015-05-16 ENCOUNTER — Other Ambulatory Visit: Payer: Self-pay

## 2015-05-16 DIAGNOSIS — Z760 Encounter for issue of repeat prescription: Secondary | ICD-10-CM

## 2015-05-16 MED ORDER — LISINOPRIL 10 MG PO TABS: 10.0000 mg | ORAL_TABLET | Freq: Every day | ORAL | Status: DC

## 2015-06-11 ENCOUNTER — Other Ambulatory Visit: Payer: Self-pay | Admitting: Urgent Care

## 2015-09-08 ENCOUNTER — Other Ambulatory Visit: Payer: Self-pay | Admitting: Physician Assistant

## 2015-12-11 ENCOUNTER — Other Ambulatory Visit: Payer: Self-pay | Admitting: Physician Assistant

## 2015-12-11 DIAGNOSIS — G8929 Other chronic pain: Secondary | ICD-10-CM

## 2015-12-11 DIAGNOSIS — M545 Low back pain: Principal | ICD-10-CM

## 2016-03-15 ENCOUNTER — Ambulatory Visit (INDEPENDENT_AMBULATORY_CARE_PROVIDER_SITE_OTHER): Payer: Self-pay | Admitting: Physician Assistant

## 2016-03-15 VITALS — BP 148/90 | HR 99 | Temp 98.8°F | Resp 17 | Ht 61.0 in | Wt 140.0 lb

## 2016-03-15 DIAGNOSIS — F341 Dysthymic disorder: Secondary | ICD-10-CM

## 2016-03-15 DIAGNOSIS — M5136 Other intervertebral disc degeneration, lumbar region: Secondary | ICD-10-CM

## 2016-03-15 DIAGNOSIS — M4126 Other idiopathic scoliosis, lumbar region: Secondary | ICD-10-CM

## 2016-03-15 DIAGNOSIS — G8929 Other chronic pain: Secondary | ICD-10-CM

## 2016-03-15 DIAGNOSIS — F32A Depression, unspecified: Secondary | ICD-10-CM | POA: Insufficient documentation

## 2016-03-15 DIAGNOSIS — Z760 Encounter for issue of repeat prescription: Secondary | ICD-10-CM

## 2016-03-15 DIAGNOSIS — R42 Dizziness and giddiness: Secondary | ICD-10-CM

## 2016-03-15 DIAGNOSIS — M545 Low back pain, unspecified: Secondary | ICD-10-CM

## 2016-03-15 DIAGNOSIS — M419 Scoliosis, unspecified: Secondary | ICD-10-CM | POA: Insufficient documentation

## 2016-03-15 DIAGNOSIS — M51369 Other intervertebral disc degeneration, lumbar region without mention of lumbar back pain or lower extremity pain: Secondary | ICD-10-CM

## 2016-03-15 DIAGNOSIS — F329 Major depressive disorder, single episode, unspecified: Secondary | ICD-10-CM | POA: Insufficient documentation

## 2016-03-15 DIAGNOSIS — I10 Essential (primary) hypertension: Secondary | ICD-10-CM

## 2016-03-15 LAB — CBC WITH DIFFERENTIAL/PLATELET
Basophils Absolute: 0 cells/uL (ref 0–200)
Basophils Relative: 0 %
EOS PCT: 2 %
Eosinophils Absolute: 106 cells/uL (ref 15–500)
HCT: 40.8 % (ref 35.0–45.0)
HEMOGLOBIN: 13.5 g/dL (ref 11.7–15.5)
LYMPHS ABS: 2173 {cells}/uL (ref 850–3900)
Lymphocytes Relative: 41 %
MCH: 31.6 pg (ref 27.0–33.0)
MCHC: 33.1 g/dL (ref 32.0–36.0)
MCV: 95.6 fL (ref 80.0–100.0)
MPV: 10.2 fL (ref 7.5–12.5)
Monocytes Absolute: 424 cells/uL (ref 200–950)
Monocytes Relative: 8 %
NEUTROS PCT: 49 %
Neutro Abs: 2597 cells/uL (ref 1500–7800)
Platelets: 295 10*3/uL (ref 140–400)
RBC: 4.27 MIL/uL (ref 3.80–5.10)
RDW: 13.1 % (ref 11.0–15.0)
WBC: 5.3 10*3/uL (ref 3.8–10.8)

## 2016-03-15 LAB — TSH: TSH: 1.53 m[IU]/L

## 2016-03-15 MED ORDER — CITALOPRAM HYDROBROMIDE 20 MG PO TABS: 20.0000 mg | ORAL_TABLET | Freq: Every day | ORAL | 6 refills | Status: DC

## 2016-03-15 MED ORDER — LISINOPRIL 10 MG PO TABS: ORAL_TABLET | ORAL | 0 refills | Status: DC

## 2016-03-15 MED ORDER — CYCLOBENZAPRINE HCL 10 MG PO TABS: 10.0000 mg | ORAL_TABLET | Freq: Three times a day (TID) | ORAL | 3 refills | Status: DC | PRN

## 2016-03-15 NOTE — Progress Notes (Signed)
Lynn Fleming  MRN: [id number] DOB: 1955-11-19  Subjective:  Pt presents to clinic with dizziness for the last month - occurs when she changes positions or moves her head - when she gets dizzy her head feels heavy - she does not have room spinning - overall it is getting better.  She has never had h/o anemia.  She has a remote h/o TIA.  She is having no weakness or headaches.  She is having no speech problems.  She is eating and drinking ok.  Her daughter is with her today who feels like she eats to much salt.  Tinnitus - occasional - not associated with current problems  Ran out of medications for about 3 months - she has HTN and has been out of her medication, her depression and sleep are really helped with the celexa she has been without.  She has chronic back pain that she takes Flexeril for that she has been out of so her back is hurting more.  She would like to go to PT to see if her back pain would get better and she would need less medications for her pain.  She takes nothing currently for her pain.  Review of Systems  Constitutional: Negative for chills and fever.  HENT: Positive for postnasal drip (not changed - this is chronic for her). Negative for congestion, ear discharge, ear pain, hearing loss and sore throat.   Eyes: Negative for visual disturbance.  Gastrointestinal: Negative for nausea.  Neurological: Positive for light-headedness. Negative for headaches.    Patient Active Problem List   Diagnosis Date Noted  . Depression 03/15/2016  . Benign essential HTN 03/15/2016  . Scoliosis of lumbar spine 03/15/2016  . Degenerative disc disease, lumbar 03/15/2016  . Headache 12/12/2013  . TIA (transient ischemic attack) 12/12/2013    Current Outpatient Prescriptions on File Prior to Visit  Medication Sig Dispense Refill  . aspirin 81 MG chewable tablet Chew 1 tablet (81 mg total) by mouth daily.     No current facility-administered medications on file prior to visit.      No Known Allergies  Pt patients past, family and social history were reviewed and updated.   Objective:  BP (!) 148/90 (BP Location: Right Arm, Patient Position: Sitting, Cuff Size: Normal)   Pulse 99   Temp 98.8 F (37.1 C) (Oral)   Resp 17   Ht 5\' 1"  (1.549 m)   Wt 140 lb (63.5 kg)   SpO2 96%   BMI 26.45 kg/m   Physical Exam  Constitutional: She is oriented to person, place, and time and well-developed, well-nourished, and in no distress.  HENT:  Head: Normocephalic and atraumatic.  Right Ear: Hearing and external ear normal.  Left Ear: Hearing and external ear normal.  Eyes: Conjunctivae are normal.  Neck: Normal range of motion.  Cardiovascular: Normal rate, regular rhythm and normal heart sounds.   No murmur heard. Pulmonary/Chest: Effort normal and breath sounds normal. She has no wheezes.  Musculoskeletal:       Lumbar back: She exhibits decreased range of motion (decrease flexion) and spasm (left side paraspinal muscles).  Scoliosis to the left lumbar region  Neurological: She is alert and oriented to person, place, and time. She has normal motor skills, normal sensation, normal strength, normal reflexes and intact cranial nerves. She displays no tremor, normal speech (per daughter) and normal reflexes. She has a normal Straight Leg Raise Test, a normal Cerebellar Exam and a normal Romberg Test. She  shows no pronator drift. Gait normal. Gait normal.  Skin: Skin is warm and dry.  Psychiatric: Mood, memory, affect and judgment normal.  Vitals reviewed.  Orthostatic VS for the past 24 hrs:  BP- Lying Pulse- Lying BP- Sitting Pulse- Sitting BP- Standing at 0 minutes Pulse- Standing at 0 minutes  03/15/16 1231 142/84 96 (!) 152/99 97 (!) 159/98 98      Assessment and Plan :  Dizziness - Plan: CBC with Differential/Platelet, COMPLETE METABOLIC PANEL WITH GFR, TSH, Orthostatic vital signs - likely related to elevated BP - she is not dehydrated but she will try to  decrease her salt intake - we are waiting on CBC to make sure she is not anemia but she has not been in the past  Dysthymia - restart medication  Benign essential HTN - Plan: TSH, lisinopril (PRINIVIL,ZESTRIL) 10 MG tablet - restart medication - recheck in a month to make sure BP is improved -   Other idiopathic scoliosis, lumbar region - Plan: Ambulatory referral to Physical Therapy - start PT to help with overall posture and strength  Degenerative disc disease, lumbar - Plan: Ambulatory referral to Physical Therapy  Chronic left-sided low back pain without sciatica - Plan: cyclobenzaprine (FLEXERIL) 10 MG tablet, Ambulatory referral to Physical Therapy   Windell Hummingbird PA-C  [facility] 03/15/2016 12:42 PM

## 2016-03-15 NOTE — Patient Instructions (Addendum)
Please restart your mediations Recheck in a month to make sure your BP is better on the medication - if your dizziness does not continue to get better once your BP is controlled please return to clinic    IF you received an x-ray today, you will receive an invoice from [facility]. Please contact [facility] at [phone] with questions or concerns regarding your invoice.   IF you received labwork today, you will receive an invoice from [employer]. Please contact [facility] at [phone] with questions or concerns regarding your invoice.   Our billing staff will not be able to assist you with questions regarding bills from these companies.  You will be contacted with the lab results as soon as they are available. The fastest way to get your results is to activate your My Chart account. Instructions are located on the last page of this paperwork. If you have not heard from [place] regarding the results in 2 weeks, please contact this office.

## 2016-03-16 LAB — COMPLETE METABOLIC PANEL WITH GFR
ALK PHOS: 80 U/L (ref 33–130)
ALT: 6 U/L (ref 6–29)
AST: 15 U/L (ref 10–35)
Albumin: 4.3 g/dL (ref 3.6–5.1)
BUN: 13 mg/dL (ref 7–25)
CALCIUM: 9.4 mg/dL (ref 8.6–10.4)
CO2: 24 mmol/L (ref 20–31)
Chloride: 104 mmol/L (ref 98–110)
Creat: 0.59 mg/dL (ref 0.50–0.99)
GFR, Est African American: >89 mL/min (ref >=60)
GLUCOSE: 203 mg/dL — AB (ref 65–99)
POTASSIUM: 4.1 mmol/L (ref 3.5–5.3)
SODIUM: 139 mmol/L (ref 135–146)
Total Bilirubin: 0.4 mg/dL (ref 0.2–1.2)
Total Protein: 7.3 g/dL (ref 6.1–8.1)

## 2016-03-25 ENCOUNTER — Telehealth: Payer: Self-pay | Admitting: Physical Therapy

## 2016-03-25 NOTE — Telephone Encounter (Signed)
12/7 & 03/25/16 spoke with family member asked patient to call to schedule PT eval

## 2016-03-28 ENCOUNTER — Encounter: Payer: Self-pay | Admitting: *Deleted

## 2016-04-11 ENCOUNTER — Other Ambulatory Visit: Payer: Self-pay | Admitting: Physician Assistant

## 2016-04-11 DIAGNOSIS — I10 Essential (primary) hypertension: Secondary | ICD-10-CM

## 2016-08-12 ENCOUNTER — Encounter: Payer: Self-pay | Admitting: Physician Assistant

## 2016-08-12 ENCOUNTER — Ambulatory Visit (INDEPENDENT_AMBULATORY_CARE_PROVIDER_SITE_OTHER): Payer: Self-pay | Admitting: Physician Assistant

## 2016-08-12 VITALS — BP 138/82 | HR 90 | Temp 98.9°F | Resp 16 | Ht 61.0 in | Wt 138.0 lb

## 2016-08-12 DIAGNOSIS — E119 Type 2 diabetes mellitus without complications: Secondary | ICD-10-CM

## 2016-08-12 DIAGNOSIS — I10 Essential (primary) hypertension: Secondary | ICD-10-CM

## 2016-08-12 DIAGNOSIS — Z1329 Encounter for screening for other suspected endocrine disorder: Secondary | ICD-10-CM

## 2016-08-12 DIAGNOSIS — Z131 Encounter for screening for diabetes mellitus: Secondary | ICD-10-CM

## 2016-08-12 DIAGNOSIS — Z1389 Encounter for screening for other disorder: Secondary | ICD-10-CM

## 2016-08-12 LAB — POCT URINALYSIS DIP (MANUAL ENTRY)
BILIRUBIN UA: NEGATIVE
Glucose, UA: NEGATIVE mg/dL
Ketones, POC UA: NEGATIVE mg/dL
Leukocytes, UA: NEGATIVE
Nitrite, UA: NEGATIVE
PH UA: 7 (ref 5.0–8.0)
Protein Ur, POC: NEGATIVE mg/dL
SPEC GRAV UA: 1.01 (ref 1.010–1.025)
UROBILINOGEN UA: 0.2 U/dL

## 2016-08-12 LAB — POCT GLYCOSYLATED HEMOGLOBIN (HGB A1C): HEMOGLOBIN A1C: 6.8

## 2016-08-12 MED ORDER — METFORMIN HCL 500 MG PO TABS: 500.0000 mg | ORAL_TABLET | Freq: Two times a day (BID) | ORAL | 3 refills | Status: DC

## 2016-08-12 MED ORDER — LISINOPRIL 10 MG PO TABS: 10.0000 mg | ORAL_TABLET | Freq: Every day | ORAL | 3 refills | Status: DC

## 2016-08-12 NOTE — Progress Notes (Signed)
08/14/2016 4:21 PM   DOB: 11-Aug-1955 / MRN: [id number]  SUBJECTIVE:  Lynn Fleming is a 61 y.o. female presenting for medication refills and screening for diabetes.  She has a history of HTN and prediabetes.  Was previous taking lisinopril and has been off of this for the last two months stating that she ran out of medication.   She tells me that she has been checking her CBG at home fasting and is reporting measures into the 160s.  She also tells me that her blood pressure often measures into the 160s.    She has No Known Allergies.   She  has a past medical history of Hepatitis B and Hypertension.    She  reports that she has never smoked. She has never used smokeless tobacco. She reports that she does not drink alcohol or use drugs. She  reports that she does not currently engage in sexual activity. The patient  has a past surgical history that includes Esophagogastroduodenoscopy (N/A, 09/30/2013) and EUS (N/A, 11/09/2013).  Her family history is not on file.  Review of Systems  Constitutional: Negative for chills, diaphoresis and fever.  Eyes: Negative.   Respiratory: Negative for cough, hemoptysis, sputum production, shortness of breath and wheezing.   Cardiovascular: Negative for chest pain, orthopnea and leg swelling.  Gastrointestinal: Negative for nausea.  Skin: Negative for rash.  Neurological: Negative for dizziness, sensory change, speech change, focal weakness and headaches.    The problem list and medications were reviewed and updated by myself where necessary and exist elsewhere in the encounter.   OBJECTIVE:  BP 138/82   Pulse 90   Temp 98.9 F (37.2 C) (Oral)   Resp 16   Ht 5\' 1"  (1.549 m)   Wt 138 lb (62.6 kg)   SpO2 95%   BMI 26.07 kg/m     Physical Exam  Constitutional: She is active.  Cardiovascular: Normal rate, regular rhythm, S1 normal, S2 normal, normal heart sounds and intact distal pulses.  Exam reveals no gallop, no friction rub and no  decreased pulses.   No murmur heard. Pulmonary/Chest: Effort normal. No stridor. No tachypnea. No respiratory distress. She has no wheezes. She has no rales.  Abdominal: She exhibits no distension.  Musculoskeletal: She exhibits no edema.  Neurological: She is alert.  Skin: Skin is warm.     Results for orders placed or performed in visit on 08/12/16 (from the past 72 hour(s))  POCT urinalysis dipstick     Status: Abnormal   Collection Time: 08/12/16 12:21 PM  Result Value Ref Range   Color, UA yellow yellow   Clarity, UA clear clear   Glucose, UA negative negative mg/dL   Bilirubin, UA negative negative   Ketones, POC UA negative negative mg/dL   Spec Grav, UA 1.010 1.010 - 1.025   Blood, UA trace-intact (A) negative   pH, UA 7.0 5.0 - 8.0   Protein Ur, POC negative negative mg/dL   Urobilinogen, UA 0.2 0.2 or 1.0 E.U./dL   Nitrite, UA Negative Negative   Leukocytes, UA Negative Negative  Basic metabolic panel     Status: Abnormal   Collection Time: 08/12/16 12:21 PM  Result Value Ref Range   Glucose 104 (H) 65 - 99 mg/dL   BUN 9 8 - 27 mg/dL   Creatinine, Ser 0.55 (L) 0.57 - 1.00 mg/dL   GFR calc non Af Amer 100 >59 mL/min/1.73   GFR calc Af Amer 115 >59 mL/min/1.73   BUN/Creatinine Ratio  16 12 - 28   Sodium 144 134 - 144 mmol/L   Potassium 3.9 3.5 - 5.2 mmol/L   Chloride 100 96 - 106 mmol/L   CO2 24 18 - 29 mmol/L   Calcium 9.8 8.7 - 10.3 mg/dL  TSH     Status: None   Collection Time: 08/12/16 12:21 PM  Result Value Ref Range   TSH 1.020 0.450 - 4.500 uIU/mL  Lipid panel     Status: Abnormal   Collection Time: 08/12/16 12:21 PM  Result Value Ref Range   Cholesterol, Total 185 100 - 199 mg/dL   Triglycerides 225 (H) 0 - 149 mg/dL   HDL 38 (L) >39 mg/dL   VLDL Cholesterol Cal 45 (H) 5 - 40 mg/dL   LDL Calculated 102 (H) 0 - 99 mg/dL   Chol/HDL Ratio 4.9 (H) 0.0 - 4.4 ratio    Comment:                                   T. Chol/HDL Ratio                                              Men  Women                               1/2 Avg.Risk  3.4    3.3                                   Avg.Risk  5.0    4.4                                2X Avg.Risk  9.6    7.1                                3X Avg.Risk 23.4   11.0   POCT glycosylated hemoglobin (Hb A1C)     Status: None   Collection Time: 08/12/16 12:25 PM  Result Value Ref Range   Hemoglobin A1C 6.8   Microalbumin, urine     Status: None   Collection Time: 08/12/16 12:31 PM  Result Value Ref Range   Albumin, Urine 7.6 Not Estab. ug/mL     No results found.  ASSESSMENT AND PLAN:  Lynn Fleming was seen today for blood pressure check, diabetes and medication refill.  Diagnoses and all orders for this visit:  Benign essential HTN -     lisinopril (PRINIVIL,ZESTRIL) 10 MG tablet; Take 1 tablet (10 mg total) by mouth daily.  Screening for diabetes mellitus -     POCT glycosylated hemoglobin (Hb A1C) -     Lipid panel -     Microalbumin, urine -     Lipid panel  Screening for nephropathy -     POCT urinalysis dipstick -     Basic metabolic panel  Screening for thyroid disorder -     TSH  Well controlled diabetes mellitus (Lynn Fleming) -     Discontinue: metFORMIN (GLUCOPHAGE) 500 MG tablet; Take 1 tablet (500 mg total) by mouth  2 (two) times daily with a meal. -     metFORMIN (GLUCOPHAGE) 500 MG tablet; Take 1 tablet (500 mg total) by mouth daily with breakfast.    The patient is advised to call or return to clinic if she does not see an improvement in symptoms, or to seek the care of the closest emergency department if she worsens with the above plan.   Philis Fendt, MHS, PA-C [facility] 08/14/2016 4:21 PM

## 2016-08-12 NOTE — Patient Instructions (Addendum)
COme back for an annual physical on May 26trh so we can do a pap smear, order a clonoscopy, and order a mamamogram.    Come back in six months for BP check and diabetes check.   IF you received an x-ray today, you will receive an invoice from [facility]. Please contact [facility] at [phone] with questions or concerns regarding your invoice.   IF you received labwork today, you will receive an invoice from [place]. Please contact [facility] at [phone] with questions or concerns regarding your invoice.   Our billing staff will not be able to assist you with questions regarding bills from these companies.  You will be contacted with the lab results as soon as they are available. The fastest way to get your results is to activate your My Chart account. Instructions are located on the last page of this paperwork. If you have not heard from [place] regarding the results in 2 weeks, please contact this office.

## 2016-08-13 LAB — BASIC METABOLIC PANEL
BUN/Creatinine Ratio: 16 (ref 12–28)
BUN: 9 mg/dL (ref 8–27)
CALCIUM: 9.8 mg/dL (ref 8.7–10.3)
CO2: 24 mmol/L (ref 18–29)
CREATININE: 0.55 mg/dL — AB (ref 0.57–1.00)
Chloride: 100 mmol/L (ref 96–106)
GFR calc Af Amer: 115 mL/min/{1.73_m2} (ref >59)
GFR calc non Af Amer: 100 mL/min/{1.73_m2} (ref >59)
GLUCOSE: 104 mg/dL — AB (ref 65–99)
Potassium: 3.9 mmol/L (ref 3.5–5.2)
Sodium: 144 mmol/L (ref 134–144)

## 2016-08-13 LAB — LIPID PANEL
CHOLESTEROL TOTAL: 185 mg/dL (ref 100–199)
Chol/HDL Ratio: 4.9 ratio — ABNORMAL HIGH (ref 0.0–4.4)
HDL: 38 mg/dL — ABNORMAL LOW (ref >39)
LDL Calculated: 102 mg/dL — ABNORMAL HIGH (ref 0–99)
TRIGLYCERIDES: 225 mg/dL — AB (ref 0–149)
VLDL Cholesterol Cal: 45 mg/dL — ABNORMAL HIGH (ref 5–40)

## 2016-08-13 LAB — TSH: TSH: 1.02 u[IU]/mL (ref 0.450–4.500)

## 2016-08-13 LAB — MICROALBUMIN, URINE: MICROALBUM., U, RANDOM: 7.6 ug/mL

## 2016-08-13 MED ORDER — METFORMIN HCL 500 MG PO TABS: 500.0000 mg | ORAL_TABLET | Freq: Every day | ORAL | 3 refills | Status: DC

## 2016-08-22 ENCOUNTER — Other Ambulatory Visit: Payer: Self-pay | Admitting: Physician Assistant

## 2016-08-22 MED ORDER — ATORVASTATIN CALCIUM 20 MG PO TABS: 20.0000 mg | ORAL_TABLET | Freq: Every day | ORAL | 3 refills | Status: DC

## 2016-08-22 NOTE — Progress Notes (Signed)
She needs to be on cholseterol medication.  I am sending this in now. Please call and speak to her daugther.  RTC in abou three months. I am forwarding to scheduling.  Philis Fendt, MS, PA-C 1:20 PM, 08/22/2016

## 2017-07-11 ENCOUNTER — Ambulatory Visit: Payer: Self-pay | Admitting: Family Medicine

## 2017-07-18 ENCOUNTER — Encounter: Payer: Self-pay | Admitting: Urgent Care

## 2017-07-18 ENCOUNTER — Ambulatory Visit (INDEPENDENT_AMBULATORY_CARE_PROVIDER_SITE_OTHER): Payer: Self-pay | Admitting: Urgent Care

## 2017-07-18 ENCOUNTER — Other Ambulatory Visit: Payer: Self-pay

## 2017-07-18 VITALS — BP 152/88 | HR 88 | Temp 98.1°F | Resp 16 | Ht 61.0 in | Wt 133.0 lb

## 2017-07-18 DIAGNOSIS — R030 Elevated blood-pressure reading, without diagnosis of hypertension: Secondary | ICD-10-CM

## 2017-07-18 DIAGNOSIS — I10 Essential (primary) hypertension: Secondary | ICD-10-CM

## 2017-07-18 DIAGNOSIS — E782 Mixed hyperlipidemia: Secondary | ICD-10-CM

## 2017-07-18 DIAGNOSIS — Z9109 Other allergy status, other than to drugs and biological substances: Secondary | ICD-10-CM

## 2017-07-18 DIAGNOSIS — E119 Type 2 diabetes mellitus without complications: Secondary | ICD-10-CM

## 2017-07-18 MED ORDER — LISINOPRIL 20 MG PO TABS: 20.0000 mg | ORAL_TABLET | Freq: Every day | ORAL | 3 refills | Status: DC

## 2017-07-18 MED ORDER — ATORVASTATIN CALCIUM 20 MG PO TABS: 20.0000 mg | ORAL_TABLET | Freq: Every day | ORAL | 3 refills | Status: DC

## 2017-07-18 MED ORDER — METFORMIN HCL ER 750 MG PO TB24: 750.0000 mg | ORAL_TABLET | Freq: Two times a day (BID) | ORAL | 3 refills | Status: DC

## 2017-07-18 MED ORDER — CETIRIZINE HCL 10 MG PO TABS: 10.0000 mg | ORAL_TABLET | Freq: Every day | ORAL | 3 refills | Status: DC

## 2017-07-18 NOTE — Progress Notes (Signed)
    MRN: [id number] DOB: Mar 04, 1956  Subjective:   Lynn Fleming is a 62 y.o. female presenting for medication refills.  DM - Managed with Metformin. Diet is not compliant. She also takes lisinopril, atorvastatin. Has felt fatigue, malaise lately. She is only taking 500mg  metformin due to GI upset. Patient denies blurred vision, polydipsia, chest pain, nausea, vomiting, abdominal pain, hematuria, polyuria, skin infections, numbness or tingling. She checks her feet daily.  HTN - Managed with lisinopril 10mg . ROS as above.   [name] has a current medication list which includes the following prescription(s): aspirin, atorvastatin, lisinopril, and metformin. Also has No Known Allergies.  Lynn Fleming  has a past medical history of Hepatitis B and Hypertension. Also  has a past surgical history that includes Esophagogastroduodenoscopy (N/A, 09/30/2013) and EUS (N/A, 11/09/2013).  Objective:   Vitals: BP (!) 152/88   Pulse 88   Temp 98.1 F (36.7 C)   Resp 16   Ht 5\' 1"  (1.549 m)   Wt 133 lb (60.3 kg)   SpO2 97%   BMI 25.13 kg/m   BP Readings from Last 3 Encounters:  07/18/17 (!) 152/88  08/12/16 138/82  03/15/16 (!) 148/90    Physical Exam  Constitutional: She is oriented to person, place, and time. She appears well-developed and well-nourished.  HENT:  Mouth/Throat: Oropharynx is clear and moist.  Eyes: No scleral icterus.  Cardiovascular: Normal rate, regular rhythm and intact distal pulses. Exam reveals no gallop and no friction rub.  No murmur heard. Pulmonary/Chest: No respiratory distress. She has no wheezes. She has no rales.  Abdominal: Soft. Bowel sounds are normal. She exhibits no distension and no mass. There is no tenderness. There is no guarding.  Neurological: She is alert and oriented to person, place, and time.  Skin: Skin is warm and dry.  Psychiatric: She has a normal mood and affect.   Assessment and Plan :   Controlled type 2 diabetes mellitus without  complication, without long-term current use of insulin (HCC) - Plan: Comprehensive metabolic panel, Lipid panel, Hemoglobin A1c, metFORMIN (GLUCOPHAGE-XR) 750 MG 24 hr tablet  Essential hypertension - Plan: lisinopril (PRINIVIL,ZESTRIL) 20 MG tablet  Elevated blood pressure reading  Mixed hyperlipidemia - Plan: atorvastatin (LIPITOR) 20 MG tablet  Environmental allergies  Labs pending, recommended dietary modifications. Will switch metformin to XL at 750mg . Increase lisinopril to 20mg . Maintain atorvastatin. Labs pending. Return-to-clinic precautions discussed, patient verbalized understanding. Otherwise, f/u in 1 month.  Jaynee Eagles, PA-C [facility] [phone] 07/18/2017  11:35 AM

## 2017-07-18 NOTE — Patient Instructions (Addendum)
Omeprazole (Prilosec) 20mg  once daily at least 1 hour prior to eating can help with gastritis, acid reflux. Hydrate well with at least 2 liters (1 gallon) of water daily. Use Zyrtec once daily for allergies.     B?nh ti?u ???ng v dinh d??ng Diabetes Mellitus and Nutrition Khi qu v? b? ti?u ???ng (b?nh ti?u ???ng), ?i?u r?t quan tr?ng l ph?i c thi quen ?n u?ng lnh m?nh b?i v nh?ng th? qu v? ?n v u?ng ?nh h??ng l?n ??n n?ng ?? ???ng huy?t (glucose) c?a qu v?. ?n th?c ph?m lnh m?nh v?i s? l??ng v?a ph?i, vo cng kho?ng th?i gian hng ngy, c th? gip qu v?:  Ki?m sot ???ng huy?t.  Gi?m nguy c? b? b?nh tim.  C?i thi?n huy?t p.  ??t ???c ho?c duy tr m??c cn n?ng c l?i cho s?c kh?e.  M?i ng??i b? ti?u ???ng khc nhau v m?i ng??i ??u c nhu c?u v? ch??ng trnh ?n u?ng khc nhau. Chuyn gia ch?m [place] s?c kh?e c?a qu v? c th? [place] qu v? trao ??i v?i m?t chuyn gia v? ch? ?? ?n v dinh d??ng (chuyn gia dinh d??ng) ?? xy d?ng ch??ng trnh ?n u?ng ph h?p nh?t v?i qu v?. Ch??ng trnh ?n u?ng c?a qu v? c th? thay ??i ty thu?c vo cc y?u t? nh?:  L??ng calo quy? vi? c?n.  Cc lo?i thu?c m quy? vi? du?ng.  Cn n??ng cu?a quy? vi?.  N?ng ?? ???ng huy?t, huy?t p v cholesterol c?a qu v?.  M?c ?? ho?t ??ng c?a qu v?.  Cc tnh tr?ng s?c kh?e khc m qu v? c, ch?ng h?n b?nh tim ho?c b?nh th?n.  Carbohydrate ?nh h??ng nh? th? no ??n ti? Carbohydrate ?nh h??ng ??n n?ng ?? ???ng huy?t c?a quy? vi? nhi?u h?n b?t k? lo?i th?c ph?m no khc. ?n carbohydrate theo cch t? nhin lm t?ng l??ng glucose trong mu qu v?. Ti?nh l???ng carbohydrate l m?t ph??ng php theo di l??ng carbohydrate m quy? vi? ?n. Ti?nh l???ng carbohydrate c vai tr quan tr?ng trong vi?c gi? cho ???ng huy?t cu?a quy? vi? ? m?c c l?i cho s?c kh?e, ??c bi?t l n?u quy? vi? ?ang du?ng insulin ho?c u?ng m?t s? thu?c nh?t ??nh tri? ti?u ???ng. Bi?t ???c l??ng carbohydrate m qu v? c th? ?n m?t cch  an ton trong m?i b?a ?n c vai tr quan tr?ng. L??ng carbohydrate ny khc nhau ? m?i ng??i. Chuyn gia dinh d??ng c?a qu v? c th? gip tnh ton l??ng carbohydrate m qu v? nn ?n vo m?i b?a ?n v b?a ?n nh?. Cc th?c ph?m ch?a carbohydrate bao g?m:  Bnh m, ng? c?c, c?m, m ?ng v bnh quy gin.  Khoai ty v ng.  ??u H Lan, ??u v ??u l?ng.  S?a v s?a chua.  Tri cy v n??c p tri cy.  ?? trng mi?ng, ch?ng h?n nh? bnh ng?t, bnh quy, kem v k?o.  R??u ?nh h??ng ??n ti nh? th? no? R??u c th? gy gi?m ???ng huy?t (h? ???ng huy?t) ??t ng?t, ??c bi?t l n?u qu v? s? d?ng insulin ho?c u?ng m?t s? thu?c nh?t ??nh tr? ti?u ???ng. H? ???ng huy?t c th? l m?t tnh tr?ng ?e d?a ma?ng s?ng. Cc tri?u ch?ng c?a h? ???ng huy?t (bu?n ng?, chng m?t v l l?n) t??ng t? nh? cc tri?u ch?ng c?a vi?c u?ng qu nhi?u r??u. N?u chuyn gia ch?m [place] s?c kh?e ni r?ng r??u an ton [employer]  v?, hy tun theo cc h??ng d?n sau:  Gi?i h?n l??ng r??u qu v? u?ng khng qu 1 ly m?i ngy v?i ph? n? khng mang thai v 2 ly m?i ngy v?i nam gi?i. M?t ly t??ng ???ng v?i 12 ao-x? bia, 5 ao-x? r??u vang, ho?c 1 ao-x? r??u m?nh.  Khng u?ng r???u khi ?o?i.  Lun b ?? n??c b?ng n?c, soda cho ng??i ?n king, ho?c tr ? khng ???ng.  Orlene Plum nh? r?ng soda thng th???ng, n??c tri cy v ca?c ?? u?ng h?n h??p khc c th? ch?a nhi?u ???ng v ph?i ???c tnh l carbohydrate.  Nh?ng l?i [place] tun th? theo ch??ng trnh ny l g? ??c nhn th?c ph?m  B?t ??u b?ng vi?c ki?m tra kch th??c kh?u ph?n trn nhn. L??ng calo, carbohydrate, ch?t bo v cc ch?t dinh d??ng khc ghi trn nhn d?a trn m?t kh?u ph?n th?c ?n. R?t nhi?u lo?i th?c ph?m ch?a nhi?u h?n m?t kh?u ph?n trn m?i bao b.  Ki?m tra t?ng s? gam (g) carbohydrate trong m?t kh?u ph?n. Qu v? c th? tnh s? kh?u ph?n carbohydrate trong m?t kh?u ph?n b?ng cch chia t?ng carbohydrate cho 15. V d?, m?t th?c ph?m ch?a t?ng 30 g carbohydrate, n s?  t??ng ???ng v?i 2 kh?u ph?n carbohydrate.  Ki?m tra s? gam (g) ch?t bo bo ha v chuy?n ha trong m?t kh?u ph?n. Ch?n th?c ph?m t ho?c khng c nh?ng ch?t bo ny.  Ki?m tra s? miligam (mg) natri trong m?i kh?u ph?n. H?u h?t m?i ng??i ??u nn gi?i h?n t?ng natri tiu thu? ?? m??c d??i 2.300 mg m?i ngy.  Lun ki?m tra thng tin dinh d??ng th?c ph?m c ???c ghi l "t bo" hay "khng bo" khng. Nh?ng th?c ph?m ny c th? c hm l??ng ???ng ph? gia ho?c carbohydrate tinh ch? cao h?n v nn trnh.  Hy trao ??i v?i chuyn gia dinh d??ng c?a qu v? ?? xc ??nh m?c tiu hng ngy ??i v?i cc ch?t dinh d??ng ghi trn nhn. Mua s?m  Trnh mua nh?ng th?c ph?m ?ng h?p, lm s?n, ho?c ch? bi?n s?n. Nh?ng th?c ph?m ny c xu h??ng c nhi?u ch?t bo, natri, ???ng b? sung h?n.  Mua s?m quanh ra ngoi c?a c?a hng t?p ha. Ch? ngy t?p trung tri cy v rau c? t??i, nhi?u lo?i h?t, th?t t??i v s?a t??i. N?u n??ng  S? d?ng ca?c ph??ng php n?u ?n nhi?t ?? th?p, ch?ng h?n nh? n??ng, thay v ph??ng php n?u nhi?t ?? cao nh? chin ng?p d?u m??.  N?u ?n b?ng cch s? d?ng cc lo?i d?u t?t cho s?c kh?e, ch?ng h?n nh? d?u  liu, canola ho?c h??ng d??ng.  Hessie Diener n?u v?i b?, kem, ho?c th?t nhi?u m?. Ln k? ho?ch cho b?a ?n  ?n cc b?a ?n chnh v cc b?a ?n nh? ??u ??n, t?t nh?t l vo cng m?t th?i ?i?m m?i ngy. Trnh nhi?n ?n trong th?i [employer].  ?n th?c ?n giu ch?t x? nh? tri cy v rau t??i, ??u v ng? c?c nguyn h?t. Trao ??i v?i chuyn gia dinh d??ng xem qu v? c th? ?n bao nhiu kh?u ph?n carbohydrate m?i b?a ?n.  ?n 4-6 ao-x? protein th?t n?c m?i ngy, ch?ng h?n nh? th?t n?c, th?t g, c, tr?ng, ho?c ??u ph?. 1 aox? t??ng ???ng v?i 1 aox? th?t, th?t g, ho?c c, 1 qu? tr?ng, ho?c 1/4 c?c ??u ph?.  ?n m?t s? th?c ph?m m?i ngy ch?a cc  ch?t bo lnh m?nh, ch?ng h?n nh? l tu, qu? h?ch, cc lo?i h?t v c. L?i s?ng   Ki?m tra ???ng huy?t th??ng xuyn.  T?p th? d?c t?i thi?u 30  pht/ngy, t? 5 ngy tr? ln m?i tu?n, ho?c theo ch? d?n c?a chuyn gia ch?m [place] s?c kh?e.  Dng thu?c theo ch? d?n c?a chuyn gia ch?m [place] s?c kh?e.  Khng s? d?ng b?t k? s?n ph?m no ch?a nicotine ho?c thu?c l, ch?ng ha?n nh? thu?c l d?ng ht v thu?c l ?i?n t?. N?u qu v? c?n gip ?? ?? cai thu?c, hy h?i chuyn gia ch?m [place] s?c kh?e.  Lm vi?c v?i t? v?n vin ho?c chuyn gia gio d?c ti?u ???ng ?? xc ??nh chi?n l??c qu?n l c?ng th?ng v b?t c? thch th?c no v? c?m xc v x h?i. M?t s? cu h?i c?n ??t ra v?i chuyn gia ch?m [place] s?c kh?e c?a ti l g?  Ti co? c?n g??p m?t chuyn gia h???ng d?n v? b?nh ti?u ????ng khng?  Ti c c?n g?p chuyn gia dinh d??ng khng?  Ti co? th? go?i cho s? na?o n?u ti co? th?c m?c?  Khi no l th?i ?i?m ki?m tra ???ng huy?t t?t nh?t? N?i ?? tm thm thng tin:  Hi?p h?i Ti?u ???ng [place] K?: [email or web address]  Vi?n Dinh D??ng v Ti?u ???ng: [email or web address]  Vi?n Ti?u ???ng v cc B?nh v? Tiu Ha v Th?n Qu?c Gia (NIH): [email or web address] Tm t?t  Ch??ng trnh b?a ?n lnh m?nh s? gip qu v? ki?m sot ???ng huy?t v duy tr l?i s?ng lnh m?nh.  Lm vi?c v?i m?t chuyn gia v? dinh d??ng v ch? ?? ?n (chuyn gia dinh d??ng) c th? gip qu v? l?p k? ho?ch b?a ?n ph h?p nh?t cho qu v?.  Lun nh? r?ng carbohydrate v r??u c tc ??ng ngay l?p t?c ln n?ng ?? ???ng huy?t c?a qu v?. ?i?u quan tr?ng l ph?i tnh carbohydrate v s? d?ng m?t r??u cch th?n tr?ng. Thng tin ny khng nh?m m?c ?ch thay th? cho l?i khuyn m chuyn gia ch?m [place] s?c kh?e ni v?i qu v?. Hy b?o ??m qu v? ph?i th?o lu?n b?t k? v?n ?? g m qu v? c v?i chuyn gia ch?m  s?c kh?e c?a qu v?. Document Released: 07/23/2015 Document Revised: 07/31/2016 Document Reviewed: 07/31/2016 Elsevier Interactive Patient Education  2018  [place].     T?ng huy?t p Hypertension T?ng huy?t p, th??ng ???c g?i l huy?t p cao, l khi l?c b?m mu qua ??ng m?ch c?a qu v? qu m?nh. ??ng m?ch c?a qu v? l cc m?ch mu mang mu t? tim ?i kh?p c? th?. T?ng huy?t p khi?n tim lm vi?c v?t v? h?n ?? b?m mu v c th? khi?n cc ??ng m?ch tr? ln h?p ho?c c?ng. T?ng huy?t p khng ???c ?i?u tr? ho?c khng ki?m sot ???c c th? d?n t?i nh?i mu c? tim, ??t qu?, b?nh th?n v nh?ng v?n ?? khc. Ch? s? ?o huy?t p g?m m?t ch? s? cao trn m?t ch? s? th?p. Huy?t a?p ly? t???ng cu?a quy? vi? la? d??i 120/80. Ch? s? ??u tin ("??nh") ???c g?i l huy?t p tm thu. ?y l s? ?o p su?t trong ??ng m?ch khi tim qu v? ??p. Ch? s? th? hai ("?y") ???c g?i l huy?t p tm tr??ng. ?y l s? ?o p su?t trong ??ng m?ch khi tim qu v? ngh?Lourdes Sledge nhn g gy ra? Khng r nguyn nhn  gy ra tnh tr?ng ny. ?i?u g lm t?ng nguy c?? M?t s? y?u t? nguy c? d?n ??n huy?t p cao c th? ki?m sot ???c. M?t s? y?u t? khc th khng. Nh?ng y?u t? qu v? c th? thay ??i  Ht thu?c.  B? b?nh ti?u ???ng tup 2, cholesterol cao, ho?c c? hai.  Khng t?p th? d?c ho?c cc ho?t ??ng th? ch?t ??y ??Marland Kitchen  Th?a cn.  ?n qu nhi?u ch?t bo, ???ng, ca-lo, ho?c mu?i (Natri).  U?ng qu nhi?u r??u. Nh?ng y?u t? kh ho?c khng th? thay ??i  B?nh th?n m?n tnh.  C ti?n s? gia ?nh b? cao huy?t p.  ?? tu?i. Nguy c? t?ng ln theo ?? tu?i.  Ch?ng t?c. Qu v? c th? c nguy c? cao h?n n?u qu v? l ng??i M? g?c Phi.  Gi?i tnh. Nam gi?i c nguy c? cao h?n ph? n? tr??c tu?i 45. Sau tu?i 63, ph? n? c nguy c? cao h?n nam gi?i.  Ng?ng th? do t?c ngh?n khi ng?.  C?ng th?ng. Cc d?u hi?u ho?c tri?u ch?ng l g? Huy?t p qu cao (c?n t?ng huy?t p) c th? gy ra:  ?au ??u.  Lo u.  Kh th?.  Ch?y mu cam.  Bu?n nn v nn.  ?au ng?c n?ng.  C? ??ng gi?t gi?t qu v? khng th? ki?m sot ???c (co gi?t).  Ch?n ?on tnh tr?ng ny nh? th? no? Tnh tr?ng  ny ???c ch?n ?on b?ng cch ?o huy?t p c?a qu v? lc qu v? ng?i, ?? tay trn m?t m?t ph?ng. B?ng qu?n thi?t b? ?o huy?t p s? ???c qu?n tr?c ti?pvo vng da cnh tay pha trn c?a qu v? ngang v?i m?c tim. Huy?t p c?n ???c ?o t nh?t hai l?n trn cng m?t cnh tay. M?t s? tnh tr?ng nh?t ??nh c th? lm cho huy?t p khc nhau gi?a tay ph?i v tay tri c?a qu v?. M?t s? y?u t? nh?t ??nh c th? khi?n ch? s? ?o huy?t p th?p h?n ho?c cao h?n so v?i bnh th??ng (t?ng) trong th?i gian ng?n:  Khi huy?t p c?a qu v? ? phng khm c?a chuyn gia ch?m [place] s?c kh?e cao h?n so v?i lc qu v? ? nh, hi?n t??ng ny ???c g?i l t?ng huy?t p o chong tr?ng. H?u h?t nh?ng ng??i b? tnh tr?ng ny ??u khng c?n dng thu?c.  Khi huy?t p c?a qu v? lc ? nh cao h?n so v?i lc qu v? ? phng khm chuyn gia ch?m [place] s?c kh?e, hi?n t??ng ny ???c g?i l t?ng huy?t p m?t n?. H?u h?t nh?ng ng??i b? tnh tr?ng ny ??u c th? c?n dng thu?c ?? ki?m sot huy?t p.  N?u qu v? c ch? s? huy?t p cao trong m?t l?n khm ho?c qu v? c huy?t p bnh th??ng c km cc y?u t? nguy c? khc:  Qu v? c th? ???c yu c?u tr? l?i vo m?t ngy khc ?? ki?m tra l?i huy?t p.  Qu v? c th? ???c yu c?u theo di huy?t p t?i nh trong vng 1 tu?n ho?c lu h?n.  N?u qu v? ???c ch?n ?on b? t?ng huy?t p, qu v? c th? c?n th?c hi?n cc xt nghi?m mu ho?c ki?m tra hnh ?nh khc ?? gip chuyn gia ch?m [place] s?c kh?e hi?u nguy c? t?ng th? m?c cc b?nh tr?ng khc. Tnh tr?ng ny ???c ?i?u tr? nh? th? no? Tnh tr?ng ny ???c ?i?u  tr? b?ng cch thay ??i l?i s?ng lnh m?nh, ch?ng h?nh nh? ?n th?c ph?m c l?i cho s?c kh?e, t?p th? d?c nhi?u h?n v gi?m l??ng r??u u?ng vo. N?u thay ??i l?i s?ng khng ?? ?? ??a huy?t p v? m?c c th? ki?m sot ???c, chuyn gia ch?m [place] s?c kh?e c th? k ??n thu?c, v n?u:  Huy?t p tm thu c?a qu v? trn 130.  Huy?t p tm tr??ng c?a qu v? trn 80.  Huy?t p m?c tiu c nhn c?a qu v? c th?  khc nhau ty thu?c v tnh tr?ng b?nh l, tu?i v cc nhn t? khc. Tun th? nh?ng h??ng d?n ny ? nh: ?n v u?ng  ?n ch? ?? giu ch?t x? v kali v t natri, ???ng ph? gia v ch?t bo. M?t k? ho?ch ?n m?u c tn ch? ?? ?n DASH (Cch ti?p c?n ?n u?ng ?? gi?m t?ng huy?t p). ?n theo cch ny: ? ?n nhi?u tri cy v rau t??i. Vo m?i b?a ?n, c? g?ng dnh m?t n?a ??a cho tri cy v rau. ? ?n ng? c?c nguyn h?t, ch?ng h?n nh? m ?ng lm t? b?t m nguyn cm, ho?c bnh m nguyn h?t. Cho ngu? c?c nguyn ca?m va?o m?t ph?n t? ??a c?a quy? vi?. ? ?n ho?c hu?ng cc s?n ph?m t? s?a t bo, ch?ng h?n nh? s?a ? b? kem ho?c s?a chua t bo. ? Trnh nh?ng mi?ng th?t nhi?u m?, th?t ? qua ch? bi?n ho?c th?t ??p mu?i v th?t gia c?m c da. Dnh kho?ng m?t ph?n t? ??a c?a qu v? cho cc protein khng m?, ch?ng h?n nh? c, th?t g khng da, ??u, tr?ng, v ??u ph?. ? Trnh nh?ng th?c ph?m ch? bi?n ho?c lm s?n. Nh?ng th?c ph?m ny th??ng c nhi?u natri, ???ng ph? gia v ch?t bo h?n.  Gi?m l??ng dng natri hng ngy c?a qu v?. H?u h?t nh?ng ng??i b? t?ng huy?t p ??u nn ?n d??i 1.500 mg natri m?i ngy.  Gi?i h?n l??ng r??u qu v? u?ng khng qu 1 ly m?i ngy v?i ph? n? khng mang thai v 2 ly m?i ngy v?i nam gi?i. M?t ly t??ng ???ng v?i 12 ao-x? bia, 5 ao-x? r??u vang, ho?c 1 ao-x? r??u m?nh. L?i s?ng  H?p tc v?i chuyn gia ch?m [place] s?c kh?e c?a qu v? ?? duy tr tr?ng l??ng c? th? c l?i cho s?c kh?e ho?c gi?m cn. Hy h?i xem tr?ng l??ng no l l t??ng cho qu v?.  Dnh t nh?t 30 pht ?? t?p th? d?c m c th? khi?n tim qu v? ??p nhanh h?n (t?p th? d?c nh?p ?i?u) h?u h?t cc ngy trong tu?n. Cc ho?t ??ng c th? bao g?m ?i b?, b?i, ho?c ??p xe.  Bao g?m bi t?p t?ng c??ng c? (bi t?p khng l?c), ch?ng h?n nh? bi t?p [place] ho?c nng t?, nh? m?t ph?n c?a thi quen luy?n t?p hng tu?n c?a qu v?. C? g?ng t?p nh?ng lo?i bi t?p ny trong vng 30 pht t?i thi?u 3 ngy m?t tu?n.  Khng s? d?ng b?t k?  s?n ph?m no ch?a nicotine ho?c thu?c l, ch?ng ha?n nh? thu?c l d?ng ht v thu?c l ?i?n t?. N?u qu v? c?n gip ?? ?? cai thu?c, hy h?i chuyn gia ch?m [place] s?c kh?e.  Theo di huy?t p c?a qu v? t?i nh theo h??ng d?n c?a chuyn gia ch?m  s?c kh?e.  Tun th? t?t c? cc cu?c  h?n khm l?i theo ch? d?n c?a chuyn gia ch?m [place] s?c kh?e. ?i?u ny c vai tr quan tr?ng. Thu?c  Ch? s? d?ng thu?c khng k ??n v thu?c k ??n theo ch? d?n c?a chuyn gia ch?m [place] s?c kh?e. Lm theo ch? d?n m?t cch c?n th?n. Thu?c ?i?u tr? huy?t p ph?i ???c dng theo ??n ? k.  Khng b? li?u thu?c huy?t p. B? li?u khi?n qu v? c nguy c? g?p ph?i cc v?n ?? v c th? lm cho thu?c gi?m hi?u qu?Marland Kitchen  Hy h?i chuyn gia ch?m [place] s?c kh?e c?a qu v? v? nh?ng tc d?ng ph? ho?c ph?n ?ng v?i thu?c m qu v? ph?i theo di. Hy lin l?c v?i chuyn gia ch?m [place] s?c kh?e n?u:  Qu v? ngh? qu v? c ph?n ?ng v?i thu?c ?ang dng.  Qu v? b? ?au ??u ti?p t?c tr? l?i (ti pht).  Qu v? c?m th?y chng m?t.  Qu v? b? s?ng ph ? m?t c chn.  Qu v? c v?n ?? v? th? l?c. Yu c?u tr? gip ngay l?p t?c n?u:  Qu v? b? ?au ??u n?ng ho?c l l?n.  Qu v? b? y?u b?t th??ng ho?c t b.  Quy? vi? ca?m th?y bi? ng?t.  Qu v? b? ?au r?t nhi?u ? ng?c ho?c b?ng.  Qu v? nn nhi?u l?n.  Qu v? b? kh th?. Tm t?t  T?ng huy?t p l khi l?c b?m mu qua ??ng m?ch c?a qu v? qu m?nh. N?u tnh tr?ng ny khng ???c ki?m sot, n c th? khi?n qu v? g?p ph?i nguy c? bi?n ch?ng nghim tr?ng.  Huy?t p m?c tiu c nhn c?a qu v? c th? khc nhau ty thu?c v tnh tr?ng b?nh l, tu?i v cc nhn t? khc. ??i v?i h?u h?t m?i ng??i, huy?t p bnh th??ng l d??i 120/80.  ?i?u tr? t?ng huy?t p b?ng cch thay ??i l?i s?ng, dng thu?c, ho?c k?t h?p c? hai. Thay ??i l?i s?ng bao g?m gi?m cn, ?n ch? ?? ?n c l?i cho s?c kh?e, t mu?i, t?p th? d?c nhi?u h?n v h?n ch? u?ng r??u. Thng tin ny khng nh?m m?c ?ch thay th? cho l?i  khuyn m chuyn gia ch?m [place] s?c kh?e ni v?i qu v?. Hy b?o ??m qu v? ph?i th?o lu?n b?t k? v?n ?? g m qu v? c v?i chuyn gia ch?m [place] s?c kh?e c?a qu v?. Document Released: 03/31/2005 Document Revised: 03/12/2016 Document Reviewed: 03/12/2016 Elsevier Interactive Patient Education  2018 [employer].     IF you received an x-ray today, you will receive an invoice from [facility]. Please contact [facility] at [phone] with questions or concerns regarding your invoice.   IF you received labwork today, you will receive an invoice from [place]. Please contact [facility] at [phone] with questions or concerns regarding your invoice.   Our billing staff will not be able to assist you with questions regarding bills from these companies.  You will be contacted with the lab results as soon as they are available. The fastest way to get your results is to activate your My Chart account. Instructions are located on the last page of this paperwork. If you have not heard from [place] regarding the results in 2 weeks, please contact this office.

## 2017-07-19 LAB — COMPREHENSIVE METABOLIC PANEL
A/G RATIO: 1.4 (ref 1.2–2.2)
ALK PHOS: 89 IU/L (ref 39–117)
ALT: 7 IU/L (ref 0–32)
AST: 17 IU/L (ref 0–40)
Albumin: 4.6 g/dL (ref 3.6–4.8)
BILIRUBIN TOTAL: 0.4 mg/dL (ref 0.0–1.2)
BUN/Creatinine Ratio: 22 (ref 12–28)
BUN: 13 mg/dL (ref 8–27)
CHLORIDE: 103 mmol/L (ref 96–106)
CO2: 24 mmol/L (ref 20–29)
Calcium: 9.8 mg/dL (ref 8.7–10.3)
Creatinine, Ser: 0.6 mg/dL (ref 0.57–1.00)
GFR calc Af Amer: 111 mL/min/{1.73_m2} (ref >59)
GFR calc non Af Amer: 97 mL/min/{1.73_m2} (ref >59)
GLOBULIN, TOTAL: 3.2 g/dL (ref 1.5–4.5)
Glucose: 104 mg/dL — ABNORMAL HIGH (ref 65–99)
POTASSIUM: 4.4 mmol/L (ref 3.5–5.2)
SODIUM: 141 mmol/L (ref 134–144)
Total Protein: 7.8 g/dL (ref 6.0–8.5)

## 2017-07-19 LAB — LIPID PANEL
CHOLESTEROL TOTAL: 189 mg/dL (ref 100–199)
Chol/HDL Ratio: 5 ratio — ABNORMAL HIGH (ref 0.0–4.4)
HDL: 38 mg/dL — ABNORMAL LOW (ref >39)
LDL Calculated: 115 mg/dL — ABNORMAL HIGH (ref 0–99)
TRIGLYCERIDES: 178 mg/dL — AB (ref 0–149)
VLDL Cholesterol Cal: 36 mg/dL (ref 5–40)

## 2017-07-19 LAB — HEMOGLOBIN A1C
Est. average glucose Bld gHb Est-mCnc: 137 mg/dL
Hgb A1c MFr Bld: 6.4 % — ABNORMAL HIGH (ref 4.8–5.6)

## 2017-07-22 ENCOUNTER — Encounter: Payer: Self-pay | Admitting: Urgent Care

## 2017-07-23 ENCOUNTER — Encounter: Payer: Self-pay | Admitting: Urgent Care

## 2017-07-23 NOTE — Progress Notes (Signed)
Notified by letter.

## 2017-08-18 ENCOUNTER — Encounter: Payer: Self-pay | Admitting: Urgent Care

## 2017-08-18 ENCOUNTER — Ambulatory Visit (INDEPENDENT_AMBULATORY_CARE_PROVIDER_SITE_OTHER): Payer: Self-pay | Admitting: Urgent Care

## 2017-08-18 ENCOUNTER — Ambulatory Visit (INDEPENDENT_AMBULATORY_CARE_PROVIDER_SITE_OTHER): Payer: Self-pay

## 2017-08-18 VITALS — BP 138/88 | HR 81 | Temp 97.9°F | Resp 16 | Ht 61.0 in | Wt 132.2 lb

## 2017-08-18 DIAGNOSIS — G894 Chronic pain syndrome: Secondary | ICD-10-CM

## 2017-08-18 DIAGNOSIS — G8929 Other chronic pain: Secondary | ICD-10-CM

## 2017-08-18 DIAGNOSIS — M5136 Other intervertebral disc degeneration, lumbar region: Secondary | ICD-10-CM

## 2017-08-18 DIAGNOSIS — M4180 Other forms of scoliosis, site unspecified: Secondary | ICD-10-CM

## 2017-08-18 DIAGNOSIS — E119 Type 2 diabetes mellitus without complications: Secondary | ICD-10-CM

## 2017-08-18 DIAGNOSIS — R5383 Other fatigue: Secondary | ICD-10-CM

## 2017-08-18 DIAGNOSIS — M8588 Other specified disorders of bone density and structure, other site: Secondary | ICD-10-CM

## 2017-08-18 DIAGNOSIS — I10 Essential (primary) hypertension: Secondary | ICD-10-CM

## 2017-08-18 DIAGNOSIS — M545 Low back pain: Secondary | ICD-10-CM

## 2017-08-18 MED ORDER — CYCLOBENZAPRINE HCL 5 MG PO TABS: 5.0000 mg | ORAL_TABLET | Freq: Three times a day (TID) | ORAL | 5 refills | Status: DC | PRN

## 2017-08-18 MED ORDER — MELOXICAM 7.5 MG PO TABS: 7.5000 mg | ORAL_TABLET | Freq: Every day | ORAL | 5 refills | Status: DC

## 2017-08-18 NOTE — Progress Notes (Signed)
MRN: [id number] DOB: January 31, 1956  Subjective:   Lynn Fleming is a 62 y.o. female presenting for follow-up on hypertension.  Denies headaches, dizziness, chest pain, belly pain, hematuria, lower leg swelling.  Patient also complains of chronic fatigue, chronic back pain and pain in general.  Reports that she would like to have her labs and x-rays reviewed from previous visits because she does not understand what they mean.  She also complains of insomnia.  She would also like to consider decreasing her metformin dose to 1 tablet/day.  Denies smoking cigarettes.  Lynn Fleming has a current medication list which includes the following prescription(s): atorvastatin, cetirizine, lisinopril, metformin, aspirin, cyclobenzaprine, and meloxicam. Also has No Known Allergies.  Lynn Fleming  has a past medical history of Hepatitis B and Hypertension. Also  has a past surgical history that includes Esophagogastroduodenoscopy (N/A, 09/30/2013) and EUS (N/A, 11/09/2013).  Objective:   Vitals: BP 138/88   Pulse 81   Temp 97.9 F (36.6 C) (Oral)   Resp 16   Ht 5\' 1"  (1.549 m)   Wt 132 lb 3.2 oz (60 kg)   SpO2 96%   BMI 24.98 kg/m   Physical Exam  Constitutional: She is oriented to person, place, and time. She appears well-developed and well-nourished.  HENT:  Mouth/Throat: Oropharynx is clear and moist.  Eyes: Pupils are equal, round, and reactive to light. EOM are normal. No scleral icterus.  Cardiovascular: Normal rate, regular rhythm and intact distal pulses. Exam reveals no gallop and no friction rub.  No murmur heard. Pulmonary/Chest: No respiratory distress. She has no wheezes. She has no rales.  Musculoskeletal:       Lumbar back: She exhibits decreased range of motion (flexion, extension) and tenderness. She exhibits no bony tenderness, no swelling, no edema and no spasm.       Back:  Neurological: She is alert and oriented to person, place, and time.  Skin: Skin is warm and dry.  Psychiatric: She  has a normal mood and affect.   Dg Lumbar Spine Complete  Result Date: 08/18/2017 CLINICAL DATA:  Acute on chronic back pain, no known injury, initial encounter EXAM: LUMBAR SPINE - COMPLETE 4+ VIEW COMPARISON:  11/06/2014 FINDINGS: Five lumbar type vertebral bodies are well visualized. Vertebral body height is well maintained with the exception of L2 stable from the prior exam and somewhat accentuated by the scoliosis. Mild scoliosis concave to the right is again seen and stable. Compensatory osteophytic changes are also noted stable from the prior exam. No pars defects are seen. Mild facet hypertrophic changes are noted. IMPRESSION: Chronic changes without acute abnormality. Electronically Signed   By: Inez Catalina M.D.   On: 08/18/2017 09:10   Assessment and Plan :   Chronic bilateral low back pain without sciatica - Plan: DG Lumbar Spine Complete  Degenerative disc disease, lumbar - Plan: DG Lumbar Spine Complete  Levoscoliosis - Plan: DG Lumbar Spine Complete  Chronic pain syndrome - Plan: ANA w/Reflex if Positive, Sedimentation Rate, CYCLIC CITRUL PEPTIDE ANTIBODY, IGG/IGA, DG Lumbar Spine Complete  Other fatigue - Plan: CBC, Thyroid Panel With TSH, Ferritin  Osteopenia of lumbar spine - Plan: DG Lumbar Spine Complete  Essential hypertension  Controlled type 2 diabetes mellitus without complication, without long-term current use of insulin (HCC)  Will change metformin to once daily. Start meloxicam and Flexeril for conservative management of her chronic back pain. Labs pending. Follow up in 6 months for diabetes and HTN. If back pain persists, follow up in  2-4 weeks for her back pain.  Jaynee Eagles, PA-C [facility] [phone] 08/18/2017  8:36 AM

## 2017-08-18 NOTE — Patient Instructions (Addendum)
Cyclobenzaprine tablets ?y l thu?c g? CYCLOBENZAPRINE l thu?c gin c? b?p. N ???c dng ?? ?i?u tr? ?au, co th?t v c?ng c? b?p. Thu?c ny c th? ???c dng cho nh?ng m?c ?ch khc; hy h?i ng??i cung c?p d?ch v? y t? ho?c d??c s? c?a mnh, n?u qu v? c th?c m?c. (CC) NHN HI?U PH? BI?N: Fexmid, Flexeril Ti c?n ph?i bo cho ng??i cung c?p d?ch v? y t? c?a mnh ?i?u g tr??c khi dng thu?c ny? H? c?n bi?t li?u qu v? hi?n c b?t k? tnh tr?ng no sau ?y hay khng: -ti?n s? b? suy tim, nh?i mu c? tim, ho?c b?nh tim khc -nh?p tim khng ??u -b?nh gan -v?n ?? v? tuy?n gip tr?ng -pha?n ??ng b?t th???ng ho??c di? ??ng v??i cyclobenzaprine, cc thu?c ch?ng tr?m c?m ba vng, ho?c lactose -pha?n ??ng b?t th???ng ho??c di? ??ng v??i ca?c d??c ph?m kha?c, th?c ph?m, thu?c nhu?m, ho??c ch?t ba?o qua?n -?ang c thai ho??c ??nh co? thai -?ang cho con bu? Ti nn s? d?ng thu?c ny nh? th? no? U?ng thu?c ny v?i m?t ly n??c. Hy lm theo cc h??ng d?n trn h?p thu?c ho?c nhn thu?c. N?u thu?c ny lm kh ch?u bao t? qu v?, hy u?ng thu?c cng v?i th?c ?n ho?c s?a. Dng thu?c ny vo nh?ng kho?ng th?i gian ??u nhau. Khng ???c dng thu?c ny nhi?u l?n h?n ? ???c ch? d?n. Hy bn v?i bc s? nhi khoa c?a qu v? v? vi?c dng thu?c ny ? tr? em. C th? c?n ch?m [place] ??c bi?t. Qu li?u: N?u qu v? cho r?ng mnh ? dng qu nhi?u thu?c ny, th hy lin l?c v?i trung tm ki?m sot ch?t ??c ho?c phng c?p c?u ngay l?p t?c. L?U : Thu?c ny ch? dnh ring cho qu v?. Khng chia s? thu?c ny v?i nh?ng ng??i khc. N?u ti l? qun m?t li?u th sao? N?u qu v? l? qun m?t li?u thu?c, hy dng li?u thu?c ? ngay khi c th?. N?u h?u nh? ? ??n gi? dng li?u thu?c k? ti?p, th ch? dng li?u thu?c k? ti?p ?Marland Kitchen Khng ???c dng li?u g?p ?i ho?c dng thm li?u. Nh?ng g c th? t??ng tc v?i thu?c ny? Khng ???c dng thu?c ny cng v?i b?t k? th? no sau ?y: -m?t s? thu?c dng ?? tr? cc b?nh nhi?m n?m, ch?ng  h?n nh? fluconazole, itraconazole, ketoconazole, posaconazole, voriconazole -cisapride -dofetilide -dronedarone -halofantrine -levomethadyl -cc d??c ph?m g?i l ch?t ?c ch? men monoamine oxidase (MAO), ch?ng h?n nh? [place], Parnate, Marplan, Eldepryl, Carbex -cc thu?c gy ng? (narcotic) ?? tr? ho -pimozide -thioridazine -ziprasidone Thu?c ny c?ng c th? t??ng tc v?i cc thu?c sau ?y: -r??u -cc thu?c khng histamine dng khi b? d? ?ng, ho v c?m l?nh -m?t s? thu?c dng cho ch?ng lo u ho?c cc v?n ?? v? gi?c ng? -m?t s? thu?c dng ?? tr? ung th? -m?t s? thu?c dng cho ch?ng tr?m c?m, ch?ng h?n nh? amitriptyline, fluoxetine, sertraline -m?t s? thu?c dng ?? tr? nhi?m trng, ch?ng h?n nh? alfuzosin, chloroquine, clarithromycin, levofloxacin, mefloquine, pentamidine, troleandomycin -m?t s? thu?c dng ?? tr? cc v?n ?? v? nh?p tim -m?t s? thu?c dng cho cc ch?ng co gi?t, ch?ng h?n nh? phenobarbital, primidone -cc thu?c nhu?m t??ng ph?n (contrast) -cc thu?c gy t ton thn, ch?ng h?n nh? halothane, isoflurane, methoxyflurane, propofol -cc thu?c gy t t?i ch?, ch?ng h?n nh? lidocaine, pramoxine, tetracaine -cc thu?c lm gin c? ?? gi?i ph?u -cc thu?c gi?m ?  au gy ng? (narcotic) -cc thu?c khc lm ko di kho?ng QT (gy ra nh?p tim b?t th??ng) -ca?c thu?c nho?m phenothiazine, ch??ng ha?n nh? chlorpromazine, mesoridazine, prochlorperazine Dan[facility] sch ny c th? khng m t? ?? h?t cc t??ng tc c th? x?y ra. Hy ??a cho ng??i cung c?p d?ch v? y t? c?a mnh danh sch t?t c? cc thu?c, th?o d??c, cc thu?c khng c?n toa, ho?c cc ch? ph?m b? sung m qu v? dng. C?ng nn bo cho h? bi?t r?ng qu v? c ht thu?c, u?ng r??u, ho?c c s? d?ng ma ty tri php hay khng. Vi th? c th? t??ng tc v?i thu?c c?a qu v?. Ti c?n ph?i theo di ?i?u g trong khi dng thu?c ny? Hy bo cho bc s? ho?c chuyn vin y t?, n?u cc tri?u ch?ng c?a qu v? khng kh h?n, ho?c tr? nn n?ng h?n. Qu v?  c th? b? bu?n ng? ho?c chng m?t. Khng ???c li xe, s? d?ng my mc, ho?c lm nh?ng vi?c c?n ph?i t?nh to cho t?i khi qu v? bi?t ???c thu?c ny ?nh h??ng ln qu v? nh? th? no. Khng ???c ng?i d?y ho?c ??ng d?y nhanh, ??c bi?t l khi qu v? l b?nh nhn l?n tu?i. ?i?u ny lm gi?m nguy c? b? chng m?t ho?c ng?t x?u. R??u c th? c?n tr? tc d?ng c?a thu?c ny. Hy trnh cc ?? u?ng c r??u. N?u ?ang dng m?t lo?i thu?c khc m c?ng gy bu?n ng?, th qu v? c th? b? nhi?u tc d?ng ph? h?n. Ha?y ??a cho ba?c si? ho??c [place] vin y t? [place] mnh danh sa?ch ca?c thu?c quy? vi? ?ang du?ng. Bc s? s? bo cho qu v? bi?t c?n dng bao nhiu thu?c. Khng ???c dng nhi?u thu?c h?n ? ???c ch? d?n. Ha?y go?i c?p c??u ?? ????c tr?? giu?p, n?u quy? vi? co? v?n ?? v? h h?p ho?c c tnh tr?ng b? bu?n ng? b?t th??ng. Qu v? c th? b? kh mi?ng. Nhai k?o cao su (chewing gum) khng c ???ng ho?c ng?m k?o c?ng, v u?ng nhi?u n??c c th? c ch. Hy lin l?c v?i bc s?, n?u v?n ?? nghim tr?ng ho?c khng ch?m d?t. Ti c th? nh?n th?y nh?ng tc d?ng ph? no khi dng thu?c ny? Nh?ng tc d?ng ph? qu v? c?n ph?i bo cho bc s? ho?c chuyn vin y t? cng s?m cng t?t: -cc ph?n ?ng d? ?ng, ch?ng h?n nh? da b? m?n ??, ng?a, n?i my ?ay, s?ng ? m?t, mi, ho?c l??i -kh th? -?au ng?c -tim ??p nhanh ho?c khng ??u -?o gic -co gi?t (kinh phong) -m?t m?i ho?c y?u ?t b?t th??ng Cc tc d?ng ph? khng c?n ph?i ch?m [place] y t? (hy bo cho bc s? ho?c chuyn vin y t?, n?u cc tc d?ng ph? ny ti?p di?n ho?c gy phi?n toi): -?au ??u -bu?n i ho?c i m?a [facility] ny c th? khng m t? ?? h?t cc tc d?ng ph? c th? x?y ra. Xin g?i t?i bc s? c?a mnh ?? ???c c? v?n chuyn mn v? cc tc d?ng ph?Sander Nephew v? c th? t??ng trnh cc tc d?ng ph? cho FDA theo s? [phone]. Ti nn c?t gi? thu?c c?a mnh ? ?u? ?? ngoi t?m tay tr? em. C?t gi? ? nhi?t ?? phng t? 15 ??n 30 ?? C (57 ??n 84 ?? F). ?ng ch?t gi/h?p  thu?c. V?t b? t?t c? thu?c ch?a dng sau ngy h?t h?n in trn  nhn thu?c ho?c bao thu?c. L?U : ?y l b?n tm t?t. N c th? khng bao hm t?t c? thng tin c th? c. N?u qu v? th?c m?c v? thu?c ny, xin trao ??i v?i bc s?, d??c s?, ho?c ng??i cung c?p d?ch v? y t? c?a mnh.  2018 Elsevier/Gold Standard (2015-02-27 00:00:00)      Meloxicam tablets ?y l thu?c g? MELOXICAM l thu?c khng vim khng ph?i steroid (non-steroidal anti-inflammatory drug - NSAID). N ???c dng ?? gi?m s?ng v ?? tr? c?n ?au. N c th? ???c dng cho b?nh vim x??ng kh?p, vim kh?p d?ng th?p, ho?c vim kh?p d?ng th?p ? ng??i v? thnh nin. Thu?c ny c th? ???c dng cho nh?ng m?c ?ch khc; hy h?i ng??i cung c?p d?ch v? y t? ho?c d??c s? c?a mnh, n?u qu v? c th?c m?c. (CC) NHN HI?U PH? BI?N: Mobic Ti c?n ph?i bo cho ng??i cung c?p d?ch v? y t? c?a mnh ?i?u g tr??c khi dng thu?c ny? H? c?n bi?t li?u qu v? c b?t k? tnh tr?ng no sau ?y khng: -cc v?n ?? v? ch?y mu -ng??i hu?t thu?c la? -gi?i ph?u b?c c?u n?i ??ng m?ch va?nh (CABG) trong vo?ng 2 tu?n qua -u?ng nhi?u h?n 3 su?t ?? u?ng co? r??u m?i nga?y -b?nh tim -huy?t a?p cao -ti?n s? ch?y mu bao t? -b?nh th?n -b?nh gan -b?nh ph?i ho??c h h?p, ch??ng ha?n nh? hen suy?n -cc v?n ?? v? bao t? ho?c ru?t -pha?n ??ng b?t th???ng ho??c di? ??ng v??i meloxicam, aspirin, ho??c ca?c thu?c khng vim khng ph?i steroid (NSAID) kha?c -pha?n ??ng b?t th???ng ho??c di? ??ng v??i ca?c d??c ph?m kha?c, th?c ph?m, thu?c nhu?m, ho??c ch?t ba?o qua?n -?ang c thai ho??c ??nh co? thai -?ang cho con bu? Ti nn s? d?ng thu?c ny nh? th? no? U?ng thu?c ny v?i m?t ly n??c ??y. Hy lm theo cc h??ng d?n trn h?p thu?c ho?c nhn thu?c. Qu v? c th? u?ng thu?c ny cng ho?c khng cng v?i th?c ?n. N?u thu?c lm kh ch?u bao t? qu v?, th hy u?ng thu?c cng v?i th?c ?n. Dng thu?c ny vo nh?ng kho?ng th?i gian ??u nhau. Khng ???c dng  thu?c ny nhi?u l?n h?n ? ???c ch? d?n. Khng ???c ng?ng s? d?ng thu?c ny, ngo?i tr? ?ang lm theo l?i khuyn c?a bc s?. D??c s? s? ??a cho qu v? m?t B?n H??ng D?n v? D??c Ph?m (MedGuide) ??c bi?t cho m?i toa thu?c v cho m?i l?n mua thm thu?c ?. Hy b?o ??m ??c k? thng tin ny m?i l?n. Hy bn v?i bc s? nhi khoa c?a qu v? v? vi?c dng thu?c ny ? tr? em. Thu?c ny c th? ???c k toa trong nh?ng tr??ng h?p ch?n l?c, nh?ng c?n ph?i th?n tr?ng. Nh?ng b?nh nhn trn 63 tu?i c th? c ph?n ?ng m?nh h?n v?i thu?c ny v c?n nh?ng l??ng phun nh? h?n. Qu li?u: N?u qu v? cho r?ng mnh ? dng qu nhi?u thu?c ny, th hy lin l?c v?i trung tm ki?m sot ch?t ??c ho?c phng c?p c?u ngay l?p t?c. L?U : Thu?c ny ch? dnh ring cho qu v?. Khng chia s? thu?c ny v?i nh?ng ng??i khc. N?u ti l? qun m?t li?u th sao? N?u qu v? l? qun m?t li?u thu?c, hy dng li?u thu?c ? ngay khi c th?. N?u h?u nh? ? ??n gi? dng li?u thu?c k? ti?p, th ch? dng li?u thu?c k? ti?p ?.  Khng ???c dng li?u g?p ?i ho?c dng thm li?u. Nh?ng g c th? t??ng tc v?i thu?c ny? Khng ???c dng thu?c ny cng v?i b?t k? th? no sau ?y: -cidofovir -ketorolac Thu?c ny c?ng c th? t??ng tc v?i cc thu?c sau ?y: -aspirin v cc thu?c gi?ng aspirin -cc thu?c dng ?? tr? huy?t p cao, b?nh tim ho?c nh?p tim khng ??u -m?t s? thu?c dng cho ch?ng tr?m c?m, tnh tr?ng lo u, ho?c cc r?i nhi?u tm th?n -m?t s? thu?c ?i?u tr? ho?c phng ng?a c?c mu ?ng, ch?ng h?n nh? warfarin, enoxaparin, dalteparin, apixaban, dabigatran, rivaroxaban -cyclosporine -digoxin -cc thu?c l?i ti?u -methotrexate -cc thu?c khng vim khng ph?i steroid (Non steroidal anti-inflamation drug - NSAID) khc, cc thu?c gi?m ?au v khng vim, ch?ng h?n nh? ibuprofen v naproxen -pemetrexed [facility] sch ny c th? khng m t? ?? h?t cc t??ng tc c th? x?y ra. Hy ??a cho ng??i cung c?p d?ch v? y t? c?a mnh danh sch t?t c? cc thu?c, th?o  d??c, cc thu?c khng c?n toa, ho?c cc ch? ph?m b? sung m qu v? dng. C?ng nn bo cho h? bi?t r?ng qu v? c ht thu?c, u?ng r??u, ho?c c s? d?ng ma ty tri php hay khng. Vi th? c th? t??ng tc v?i thu?c c?a qu v?. Ti c?n ph?i theo di ?i?u g trong khi dng thu?c ny? Hy bo cho bc s? ho?c chuyn vin y t?, n?u cc tri?u ch?ng c?a qu v? khng kh h?n, ho?c tr? nn n?ng h?n. Khng ???c dng cc thu?c khc c ch?a aspirin, ibuprofen, ho?c naproxen chung v?i thu?c ny. Cc tc d?ng ph? nh? kh ch?u ? bao t?, bu?n i, ho?c lot c th? d? x?y ra h?n. C nhi?u lo?i thu?c khng c?n k toa khng ???c dng chung v?i thu?c ny. Thu?c ny c th? gy ra lot v ch?y mu bao t? v ru?t vo b?t k? lc no trong th?i gian ?i?u tr?. ?i?u ny c th? x?y ra m khng c c?nh bo v c th? gy t? vong. Nguy c? gia t?ng khi dng thu?c ny trong m?t th?i gian di. Ht thu?c l, u?ng r??u, tu?i gi v tnh tr?ng s?c kh?e km c?ng c th? lm t?ng nguy c?. Hy l?p t?c lin l?c v?i bc s? ho?c chuyn vin y t?, n?u qu v? b? ?au bao t? ho?c c mu trong ch?t nn ho?c phn. Thu?c ny khng phng ng?a ???c nh?i mu c? tim ho?c ??t qu?Marland Kitchen Th?c ra, thu?c ny c th? lm t?ng nguy c? nh?i mu c? tim ho?c ??t qu?Marland Kitchen Nguy c? ny c th? t?ng ln cng v?i vi?c s? d?ng lu di thu?c ny v ? ng??i c b?nh tim. N?u qu v? dng aspirin ?? phng ng?a nh?i mu c? tim ho?c ??t qu?, th hy bn v?i bc s? ho?c chuyn vin y t?. Ti c th? nh?n th?y nh?ng tc d?ng ph? no khi dng thu?c ny? Nh?ng tc d?ng ph? qu v? c?n ph?i bo cho bc s? ho?c chuyn vin y t? cng s?m cng t?t: -cc ph?n ?ng d? ?ng, ch?ng h?n nh? da b? m?n ??, ng?a, n?i my ?ay, s?ng ? m?t, mi, ho?c l??i -bu?n i ho?c i m?a -cc d?u hi?u v tri?u ch?ng c?a c?c mu ?ng, nh? l cc v?n ?? v? h h?p; thay ??i th? l?c; ?au ng?c; ?au ??u ??t ng?t v d? d?i; ?au, s?ng, nng ? chn; ni kh; t ho?c y?u ??t  ng?t ? m?t, tay ho?c chn -cc d?u hi?u v tri?u ch?ng xu?t  huy?t, nh? l phn c mu ho?c c mu ?en, mu h?c n; n??c ti?u mu ?? ho?c mu nu s?m; kh?c ra mu ho?c ra ch?t mu nu gi?ng nh? b?t c ph; cc ??m ?? trn da, cc v?t b?m tm ho?c ch?y mu b?t th??ng ? m?t; n??u r?ng ho?c m?i -cc d?u hi?u v tri?u ch?ng t?n th??ng gan, ch?ng h?n nh? n??c ti?u mu nu ho?c vng s?m; c?m gic b? b?nh ki?u chung chung ho?c cc tri?u ch?ng gi?ng nh? cm; phn b?c mu; m?t c?m gic ngon mi?ng; bu?n i; ?au vng b?ng trn; y?u ?t ho?c m?t m?i khc th??ng; vng da ho?c m?t -cc d?u hi?u v tri?u ch?ng ??t qu?, ch?ng h?n nh? thay ??i th? l?c; l l?n; ni kh ho?c kh hi?u ?i?u ng??i khc ni; ?au ??u d? d?i; ??t ng?t b? t ho?c y?u ? m?t, tay ho?c chn; kh kh?n khi b??c ?i; chng m?t; m?t th?ng b?ng ho?c ph?i h?p ??ng tc Cc tc d?ng ph? khng c?n ph?i ch?m [place] y t? (hy bo cho bc s? ho?c chuyn vin y t?, n?u cc tc d?ng ph? ny ti?p di?n ho?c gy phi?n toi): -to bn -tiu ch?y -??y h?i [facility] sch ny c th? khng m t? ?? h?t cc tc d?ng ph? c th? x?y ra. Xin g?i t?i bc s? c?a mnh ?? ???c c? v?n chuyn mn v? cc tc d?ng ph?Sander Nephew v? c th? t??ng trnh cc tc d?ng ph? cho FDA theo s? [phone]. Ti nn c?t gi? thu?c c?a mnh ? ?u? ?? ngoi t?m tay tr? em. C?t gi? ? nhi?t ?? phng t? 15 ??n 30 ?? C (57 ??n 84 ?? F). V?t b? t?t c? thu?c ch?a dng sau ngy h?t h?n in trn nhn thu?c ho?c bao thu?c. L?U : ?y l b?n tm t?t. N c th? khng bao hm t?t c? thng tin c th? c. N?u qu v? th?c m?c v? thu?c ny, xin trao ??i v?i bc s?, d??c s?, ho?c ng??i cung c?p d?ch v? y t? c?a mnh.  2018 Elsevier/Gold Standard (2015-07-16 00:00:00)     Degenerative Disk Disease Degenerative disk disease is a condition caused by the changes that occur in spinal disks as you grow older. Spinal disks are soft and compressible disks located between the bones of your spine (vertebrae). These disks act like shock absorbers. Degenerative disk disease can affect the  whole spine. However, the neck and lower back are most commonly affected. Many changes can occur in the spinal disks with aging, such as:  The spinal disks may dry and shrink.  Small tears may occur in the tough, outer covering of the disk (annulus).  The disk space may become smaller due to loss of water.  Abnormal growths in the bone (spurs) may occur. This can put pressure on the nerve roots exiting the spinal canal, causing pain.  The spinal canal may become narrowed.  What increases the risk?  Being overweight.  Having a family history of degenerative disk disease.  Smoking.  There is increased risk if you are doing heavy lifting or have a sudden injury. What are the signs or symptoms? Symptoms vary from person to person and may include:  Pain that varies in intensity. Some people have no pain, while others have severe pain. The location of the pain depends on the part of your backbone that is affected. ? You will  have neck or arm pain if a disk in the neck area is affected. ? You will have pain in your back, buttocks, or legs if a disk in the lower back is affected.  Pain that becomes worse while bending, reaching up, or with twisting movements.  Pain that may start gradually and then get worse as time passes. It may also start after a major or minor injury.  Numbness or tingling in the arms or legs.  How is this diagnosed? Your health care provider will ask you about your symptoms and about activities or habits that may cause the pain. He or she may also ask about any injuries, diseases, or treatments you have had. Your health care provider will examine you to check for the range of movement that is possible in the affected area, to check for strength in your extremities, and to check for sensation in the areas of the arms and legs supplied by different nerve roots. You may also have:  An X-ray of the spine.  Other imaging tests, such as MRI.  How is this  treated? Your health care provider will advise you on the best plan for treatment. Treatment may include:  Medicines.  Rehabilitation exercises.  Follow these instructions at home:  Follow proper lifting and walking techniques as advised by your health care provider.  Maintain good posture.  Exercise regularly as advised by your health care provider.  Perform relaxation exercises.  Change your sitting, standing, and sleeping habits as advised by your health care provider.  Change positions frequently.  Lose weight or maintain a healthy weight as advised by your health care provider.  Do not use any tobacco products, including cigarettes, chewing tobacco, or electronic cigarettes. If you need help quitting, ask your health care provider.  Wear supportive footwear.  Take medicines only as directed by your health care provider. Contact a health care provider if:  Your pain does not go away within 1-4 weeks.  You have significant appetite or weight loss. Get help right away if:  Your pain is severe.  You notice weakness in your arms, hands, or legs.  You begin to lose control of your bladder or bowel movements.  You have fevers or night sweats. This information is not intended to replace advice given to you by your health care provider. Make sure you discuss any questions you have with your health care provider. Document Released: 01/26/2007 Document Revised: 09/06/2015 Document Reviewed: 08/02/2013 Elsevier Interactive Patient Education  2018 [employer].     IF you received an x-ray today, you will receive an invoice from [facility]. Please contact [facility] at [phone] with questions or concerns regarding your invoice.   IF you received labwork today, you will receive an invoice from [facility]. Please contact [facility] at [phone] with questions or concerns regarding your invoice.   Our billing staff will not be able to assist you with  questions regarding bills from these companies.  You will be contacted with the lab results as soon as they are available. The fastest way to get your results is to activate your My Chart account. Instructions are located on the last page of this paperwork. If you have not heard from [place] regarding the results in 2 weeks, please contact this office.

## 2017-08-19 LAB — CBC
Hematocrit: 38.4 % (ref 34.0–46.6)
Hemoglobin: 12.7 g/dL (ref 11.1–15.9)
MCH: 31.4 pg (ref 26.6–33.0)
MCHC: 33.1 g/dL (ref 31.5–35.7)
MCV: 95 fL (ref 79–97)
PLATELETS: 287 10*3/uL (ref 150–379)
RBC: 4.04 x10E6/uL (ref 3.77–5.28)
RDW: 13.2 % (ref 12.3–15.4)
WBC: 5.9 10*3/uL (ref 3.4–10.8)

## 2017-08-19 LAB — ANA W/REFLEX IF POSITIVE: Anti Nuclear Antibody(ANA): NEGATIVE

## 2017-08-19 LAB — FERRITIN: Ferritin: 437 ng/mL — ABNORMAL HIGH (ref 15–150)

## 2017-08-19 LAB — THYROID PANEL WITH TSH
Free Thyroxine Index: 2 (ref 1.2–4.9)
T3 Uptake Ratio: 25 % (ref 24–39)
T4 TOTAL: 7.9 ug/dL (ref 4.5–12.0)
TSH: 1.73 u[IU]/mL (ref 0.450–4.500)

## 2017-08-19 LAB — CYCLIC CITRUL PEPTIDE ANTIBODY, IGG/IGA: Cyclic Citrullin Peptide Ab: 5 units (ref 0–19)

## 2017-08-19 LAB — SEDIMENTATION RATE: SED RATE: 35 mm/h (ref 0–40)

## 2017-08-22 ENCOUNTER — Encounter: Payer: Self-pay | Admitting: Urgent Care

## 2017-08-22 ENCOUNTER — Ambulatory Visit (INDEPENDENT_AMBULATORY_CARE_PROVIDER_SITE_OTHER): Payer: Self-pay | Admitting: Urgent Care

## 2017-08-22 ENCOUNTER — Other Ambulatory Visit: Payer: Self-pay

## 2017-08-22 VITALS — BP 108/74 | HR 88 | Temp 98.1°F | Ht 61.0 in | Wt 133.0 lb

## 2017-08-22 DIAGNOSIS — R5383 Other fatigue: Secondary | ICD-10-CM

## 2017-08-22 DIAGNOSIS — R7989 Other specified abnormal findings of blood chemistry: Secondary | ICD-10-CM

## 2017-08-22 DIAGNOSIS — G894 Chronic pain syndrome: Secondary | ICD-10-CM

## 2017-08-22 NOTE — Progress Notes (Signed)
    MRN: [id number] DOB: 1956/01/16  Subjective:   Lynn Fleming is a 62 y.o. female presenting for follow up on chronic pain, fatigue. At her last office visit, we obtained more labs and a lumbar x-ray. She wanted to set up follow up for review in clinic and further work up.  We have tried outside clinic to discuss results unsuccessfully.  Lynn Fleming has a current medication list which includes the following prescription(s): aspirin, atorvastatin, cetirizine, cyclobenzaprine, lisinopril, meloxicam, and metformin. Also has No Known Allergies.  Lynn Fleming  has a past medical history of Hepatitis B and Hypertension. Also  has a past surgical history that includes Esophagogastroduodenoscopy (N/A, 09/30/2013) and EUS (N/A, 11/09/2013).  Objective:   Vitals: BP 108/74 (BP Location: Left Arm, Patient Position: Sitting, Cuff Size: Normal)   Pulse 88   Temp 98.1 F (36.7 C) (Oral)   Ht 5\' 1"  (1.549 m)   Wt 133 lb (60.3 kg)   SpO2 95%   BMI 25.13 kg/m   The 10-year ASCVD risk score Mikey Bussing DC Jr., et al., 2013) is: 10.4%   Values used to calculate the score:     Age: 48 years     Sex: Female     Is Non-Hispanic African American: No     Diabetic: Yes     Tobacco smoker: No     Systolic Blood Pressure: 093 mmHg     Is BP treated: Yes     HDL Cholesterol: 38 mg/dL     Total Cholesterol: 189 mg/dL  Physical Exam  Constitutional: She is oriented to person, place, and time. She appears well-developed and well-nourished.  Cardiovascular: Normal rate.  Pulmonary/Chest: Effort normal.  Neurological: She is alert and oriented to person, place, and time.   Assessment and Plan :   Elevated ferritin - Plan: Transferrin Saturation, Pathologist smear review  Other fatigue - Plan: Transferrin Saturation  Chronic pain syndrome - Plan: Transferrin Saturation  Vision has significantly elevated ferritin without any other known contributing factors.  Transferrin saturation is pending.  Also reviewed her x-ray  results in clinic.  Her scoliosis and degenerative changes are stable her lumbar spine.  Jaynee Eagles, PA-C [facility] [phone] 08/22/2017 11:43 AM

## 2017-08-22 NOTE — Patient Instructions (Addendum)
Hemochromatosis Hemochromatosis, also called iron storage disease, is a condition in which the body stores too much iron. The excess iron builds up in your joints, heart, liver, pancreas, and other organs and damages them. What are the causes? Hemochromatosis may be caused by:  Abnormal genes. These are passed down (inherited) from both of your parents.  Having blood transfusions.  Having too much iron in your diet.  What increases the risk?  Being Caucasian.  Inheriting abnormal genes from both your parents.  Having a severe or long-term loss of red blood cells (anemia). What are the signs or symptoms? Signs and symptoms can start at any age, but usually start in middle age. They may include:  Fatigue.  Weakness.  Joint pain.  Abdominal pain.  Weight loss.  Pearline Cables or bronze skin coloring.  Loss of interest in sex.  Loss of menstrual periods in women.  Loss of body hair.  Shortness of breath.  Late signs of hemochromatosis include damage to the liver, heart, or pancreas. This may lead to:  Liver cancer.  Abnormal heart rhythms.  Heart failure.  Diabetes.  How is this diagnosed? Your health care provider will perform a physical exam and ask about your symptoms and family history. Tests may be done to confirm the diagnosis. Blood tests may include:  Transferrin saturation. This test measures how much iron is bound to hemoglobin in your blood. Hemoglobin is a substance in red blood cells that carries oxygen to the tissues of the body.  Serum ferritin. This test measures a protein that stores iron in your blood.  A test to check for the abnormal genes that cause the condition.  You may have a tissue sample taken from your liver with a needle (biopsy) for testing. The results will show if iron is building up in your liver. How is this treated? Hemochromatosis is treated by removing iron by taking blood (phlebotomy). Having a phlebotomy is similar to having blood  taken for donation. The procedure is simple and effective as long as it is started before organ damage develops. When you begin treatment, you may have a pint of blood removed once or twice a week. You may have blood tests done to determine when your iron levels return to normal. Once your iron levels are normal, you may only need to have a phlebotomy every few months. Follow these instructions at home:  Do not eat a lot of foods that are high in iron. Iron-rich foods include red meats and organ meats.  Do not take dietary supplements that contain iron.  Do not take vitamin C supplements. Vitamin C increases iron absorption.  Do not eat raw shellfish or raw fish. Hemochromatosis may increase your chance of infection from these foods.  If you have liver damage, do not drink any alcohol.  If you do not have liver damage, limit alcohol intake to no more than 1 drink per day for nonpregnant women and 2 drinks per day for men. One drink equals 12 ounces of beer, 5 ounces of wine, or 1 ounces of hard liquor.  Try to exercise for at least 30 minutes on most days of the week.  Keep all follow-up visits as directed by your health care provider. This is important. Contact a health care provider if:  You have fatigue.  You have weakness.  You have joint pain.  You have abdominal pain.  You experience weight loss.  You have shortness of breath. Get help right away if:  You have  chest pain.  You have trouble breathing. This information is not intended to replace advice given to you by your health care provider. Make sure you discuss any questions you have with your health care provider. Document Released: 03/28/2000 Document Revised: 09/06/2015 Document Reviewed: 05/25/2013 Elsevier Interactive Patient Education  2018 [employer].     IF you received an x-ray today, you will receive an invoice from [facility]. Please contact [facility] at [phone] with  questions or concerns regarding your invoice.   IF you received labwork today, you will receive an invoice from [facility]. Please contact [facility] at [phone] with questions or concerns regarding your invoice.   Our billing staff will not be able to assist you with questions regarding bills from these companies.  You will be contacted with the lab results as soon as they are available. The fastest way to get your results is to activate your My Chart account. Instructions are located on the last page of this paperwork. If you have not heard from [place] regarding the results in 2 weeks, please contact this office.

## 2017-08-27 LAB — PATHOLOGIST SMEAR REVIEW
BASOS: 0 %
Basophils Absolute: 0 10*3/uL (ref 0.0–0.2)
EOS (ABSOLUTE): 0.3 10*3/uL (ref 0.0–0.4)
Eos: 5 %
HEMATOCRIT: 38.2 % (ref 34.0–46.6)
HEMOGLOBIN: 12.4 g/dL (ref 11.1–15.9)
Immature Grans (Abs): 0 10*3/uL (ref 0.0–0.1)
Immature Granulocytes: 0 %
LYMPHS ABS: 2.4 10*3/uL (ref 0.7–3.1)
Lymphs: 40 %
MCH: 30.8 pg (ref 26.6–33.0)
MCHC: 32.5 g/dL (ref 31.5–35.7)
MCV: 95 fL (ref 79–97)
MONOS ABS: 0.5 10*3/uL (ref 0.1–0.9)
Monocytes: 8 %
NEUTROS ABS: 2.9 10*3/uL (ref 1.4–7.0)
Neutrophils: 47 %
PATH REV RBC: NORMAL
Path Rev PLTs: NORMAL
Path Rev WBC: NORMAL
Platelets: 260 10*3/uL (ref 150–379)
RBC: 4.02 x10E6/uL (ref 3.77–5.28)
RDW: 13.3 % (ref 12.3–15.4)
WBC: 6 10*3/uL (ref 3.4–10.8)

## 2017-08-27 LAB — TRANSFERRIN SATURATION
IRON SATN MFR SERPL: 23 %{saturation}
IRON SERPL-MCNC: 78 ug/dL
TRANSFERRIN SERPL-MCNC: 239 mg/dL

## 2017-08-29 ENCOUNTER — Encounter: Payer: Self-pay | Admitting: Urgent Care

## 2017-10-01 ENCOUNTER — Ambulatory Visit: Payer: Self-pay | Admitting: Family Medicine

## 2017-10-01 ENCOUNTER — Other Ambulatory Visit: Payer: Self-pay

## 2017-10-01 ENCOUNTER — Encounter: Payer: Self-pay | Admitting: Family Medicine

## 2017-10-01 VITALS — BP 130/78 | HR 82 | Temp 98.1°F | Ht 61.42 in | Wt 134.4 lb

## 2017-10-01 DIAGNOSIS — G8929 Other chronic pain: Secondary | ICD-10-CM

## 2017-10-01 DIAGNOSIS — M150 Primary generalized (osteo)arthritis: Secondary | ICD-10-CM

## 2017-10-01 DIAGNOSIS — M159 Polyosteoarthritis, unspecified: Secondary | ICD-10-CM

## 2017-10-01 DIAGNOSIS — M545 Low back pain: Secondary | ICD-10-CM

## 2017-10-01 MED ORDER — DICLOFENAC SODIUM 75 MG PO TBEC: 75.0000 mg | DELAYED_RELEASE_TABLET | Freq: Two times a day (BID) | ORAL | 5 refills | Status: DC

## 2017-10-01 MED ORDER — DICLOFENAC SODIUM 75 MG PO TBEC: 75.0000 mg | DELAYED_RELEASE_TABLET | Freq: Two times a day (BID) | ORAL | 2 refills | Status: DC

## 2017-10-01 NOTE — Patient Instructions (Addendum)
If you take the diclofenac rx to [place] and buy a membership (should only be $5-10 once a year probably), this medication will only cost $6/mo to be filled - the [facility] have a pharmacy on "[address] which is the one at [employer] and I think the [employer] in [facility] also has a pharmacy.  If you take it to [facility], it will cost ~$20/month.  IF you received an x-ray today, you will receive an invoice from [facility]. Please contact [facility] at [phone] with questions or concerns regarding your invoice.   IF you received labwork today, you will receive an invoice from [facility]. Please contact [facility] at [phone] with questions or concerns regarding your invoice.   Our billing staff will not be able to assist you with questions regarding bills from these companies.  You will be contacted with the lab results as soon as they are available. The fastest way to get your results is to activate your My Chart account. Instructions are located on the last page of this paperwork. If you have not heard from [place] regarding the results in 2 weeks, please contact this office.     Vim x??ng kh?p Osteoarthritis Vim x??ng kh?p l m?t lo?i vim kh?p ?nh h??ng ??n m bao b?c cc ??u mt c?a x??ng trong kh?p (s?n). S?n ?ng vai tr l l?p ??m gi?a cc x??ng v gip x??ng di chuy?n tr?n tru. Vim x??ng kh?p x?y ra khi s?n trong cc kh?p b? mn. ?i khi, vim x??ng kh?p ???c g?i l vim kh?p "mn v rch". Vim x??ng kh?p l d?ng ph? bi?n nh?t c?a vim kh?p. B?nh th??ng g?p ? ng??i cao tu?i. ?y l trnh tr?ng tr? nn tr?m tr?ng h?n theo th?i gian (tnh tr?ng ti?n tri?n). Nh?ng kh?p th??ng b? ?nh h??ng nh?t b?i tnh tr?ng ny l kh?p ?:  Ngn tay.  Ngn chn.  Hng.  ??u g?i.  C?t s?ng, bao g?m c? c? v l?ng d??i.  Nguyn nhn g gy ra? Tnh tr?ng ny l do s? mn d?n theo tu?i tc c?a s?n bao b?c cc ??u mt c?a  x??ng. ?i?u g lm t?ng nguy c?? Nh?ng y?u t? sau c th? lm qu v? d? b? tnh tr?ng ny h?n:  Tu?i cao.  Th?a cn ho?c bo ph.  Dng qu nhi?u cc kh?p, nh? l ? v?n ??ng vin th? thao.  T?n th??ng tr??c ?y ? kh?p.  Ph?u thu?t tr??c ?y trn kh?p.  Ti?n s? gia ?nh b? vim x??ng kh?p.  Cc d?u hi?u v tri?u ch?ng l g? Nh?ng tri?u ch?ng chnh c?a tnh tr?ng ny l ?au, s?ng v c?ng ? kh?p. Theo th?i gian, kh?p c th? b? m?t hnh d?ng bnh th??ng c?a n. Cc m?nh nh? x??ng ho?c s?n c th? v? ra v tri vo bn trong kh?p, tnh tr?ng ny c th? lm ?au n?ng h?n v gy t?n th??ng kh?p. M?t l??ng x??ng nh? tch t? (ch?i x??ng) c th? pht tri?n trn cc b? kh?p. Cc tri?u ch?ng khc c th? bao g?m:  C?m gic rt ho?c c? xt trong kh?p khi b?n di chuy?n.  C ti?ng "l?c c?c" ho?c "l?o x?o" khi b?n di chuy?n.  Cc tri?u ch?ng c th? ?nh h??ng ??n m?t ho?c nhi?u kh?p. Vim x??ng kh?p trong m?t kh?p l?n, nh? l ??u g?i ho?c hng, c th? gy ?au ??n khi ?i l?i ho?c t?p th? d?c. N?u b? vim x??ng kh?p ?  bn tay, qu v? c kh? n?ng khng th? c?m n?m, v?n tay ho?c ki?m sot nh?ng c? ??ng nh? c?a tay v ngn tay (k? n?ng v?n ??ng tinh). Ch?n ?on tnh tr?ng ny nh? th? no? Tnh tr?ng ny c th? ???c ch?n ?on d?a vo:  B?nh s? c?a qu v?.  Khm th?c th?.  Tri?u ch?ng c?a qu v?.  Ch?p X quang (cc) kh?p b? ?nh h??ng.  Xt nghi?m mu ?? lo?i tr? cc lo?i vim kh?p khc.  Tnh tr?ng ny ???c ?i?u tr? nh? th? no? Khng c cch tr? d?t ?i?m tnh tr?ng ny, nh?ng ?i?u tr? c th? gip ki?m sot c?n ?au v c?i thi?n ch?c n?ng kh?p. Cc k? ho?ch ?i?u tr? c th? bao g?m:  M?t ch??ng trnh t?p th? d?c ???c ch? ??nh cho php ngh? ng?i v gi?m ?au kh?p. Qu v? c th? lm vi?c v?i bc s? v?t l tr? li?u.  M?t k? ho?ch ki?m sot cn n?ng.  Cc k? thu?t gi?m ?au nh?: ? Ch??m nng v l?nh cho kh?p. ? Xung ?i?n ???c chuy?n ??n cc ??u dy th?n kinh d??i da (kch thch h? th?n kinh b?ng ?i?n qua  da, hay TENS). ? [place] bp. ? M?t s? th?c ph?m b? sung dinh d??ng nh?t ??nh.  Cc thu?c NSAID ho?c thu?c k ??n gip gi?m ?au.  Thu?c gip gi?m ?au v vim (corticosteroids). Thu?c ny c th? ???c dng b?ng cch u?ng (qua ???ng mi?ng) ho?c tim.  Cc d?ng c? h? tr?, nh? l n?p, b?ng, thanh n?p, g?ng tay chuyn d?ng ho?c g?y ch?ng.  Ph?u thu?t, nh? l: ? Th? thu?t ??c x??ng. Th? thu?t ny ???c th?c hi?n ?? ??t l?i v? tr cc x??ng v gi?m ?au ho?c l?y nh?ng m?nh x??ng v s?n r?i ra. ? Ph?u thu?t thay kh?p. Qu v? c th? c?n ph?u thu?t ny n?u tnh tr?ng vim x??ng kh?p c?a qu v? r?t x?u (ti?n tri?n).  Tun th? nh?ng h??ng d?n ny ? nh: Ho?t ??ng  ?? cho kh?p b? ?nh h??ng ngh? ng?i theo ch? d?n c?a chuyn gia ch?m [place] s?c kh?e.  Khng li xe ho?c v?n hnh my mc h?ng n?ng trong khi dng thu?c gi?m ?au ???c k ??n.  T?p th? d?c theo ch? d?n. Chuyn gia ch?m [place] s?c kh?e ho?c bc s? v?t l tr? li?u c th? gi?i thi?u cc lo?i bi t?p c? th?, nh? l: ? T?p t?ng c??ng s?c m?nh. Cc bi t?p ny ???c th?c hi?n ?? t?ng c??ng c? b?p h? tr? cc kh?p b? ?nh h??ng b?i vim kh?p. Cc bi t?p ny c th? ???c th?c hi?n v?i cc qu? t? ho?c cc d?i b?ng t?p ?? thm s?c ch?u ??ng. ? Ho?t ??ng hi?u kh. Nh?ng bi t?p ny, ch?ng h?n nh? ?i b? nhanh ho?c th? d?c nh?p ?i?u d??i n??c, lm cho tim qu v? ??p m?nh. ? Cc ho?t ??ng gip cho kh?p m?m d?o. Nh?ng ho?t ??ng ny gi? cho kh?p c?a qu v? d? dng di chuy?n. ? Cc bi t?p cn b?ng v nhanh nh?n. Ki?m sot c?n ?au, c?ng kh?p v s?ng n?  N?u ???c chi? d?n, ha?y ch???m no?ng va?o vu?ng bi? a?nh h??ng th??ng xuyn nh? chi? d?n cu?a chuyn gia ch?m so?c s??c kho?e. S? d?ng ngu?n nhi?t m chuyn gia ch?m [place] s?c kh?e khuy?n ngh?, ch?ng h?n nh? ti ch??m nhi?t ?m ho?c ??m ch??m nng. ? N?u qu v? dng d?ng c? h? tr? tho ra ???c, hy tho  theo ch? d?n c?a chuyn gia ch?m [place] s?c kh?e. ? ?? kh?n t?m ? gi?a da v ngu?n nhi?t. N?u chuyn gia ch?m [place] s?c kh?e  yu c?u qu v? ?? nguyn d?ng c? h? tr? trong khi ch??m nng, hy ??t m?t chi?c kh?n t?m gi?a d?ng c? h? tr? v ngu?n nhi?t. ? Duy tr ngu?n nhi?t trong 20-30 pht. ? B? ngu?n nhi?t ra n?u da qu v? chuy?n sang mu ?? nh?t. ?i?u ny ??c bi?t quan tr?ng n?u qu v? khng th? c?m th?y ?au, nng, hay l?nh. Qu v? c th? c nguy c? b? b?ng cao h?n.  N?u ???c ch? d?n, hy ch??m ? ln kh?p b? ?nh h??ng: ? N?u qu v? dng d?ng c? h? tr? tho ra ???c, hy tho theo ch? d?n c?a chuyn gia ch?m [place] s?c kh?e. ? Cho ? l?nh vo ti ni lng. ? ?? kh?n t?m ? gi?a da v ti ch??m. N?u chuyn gia ch?m [place] s?c kh?e yu c?u qu v? ?? nguyn d?ng c? h? tr? trong khi ch??m l?nh, hy ??t m?t chi?c kh?n t?m gi?a d?ng c? h? tr? v ti ch??m. ? Ch??m ? l?nh trong 20 pht, 2-3 l?n m?i ngy. H??ng d?n chung  Ch? s? d?ng thu?c khng k ??n v thu?c k ??n theo ch? d?n c?a chuyn gia ch?m [place] s?c kh?e.  Duy tr m??c cn n?ng c l?i cho s?c kh?e. Tun th? theo ch? ??n c?a chuyn gia ch?m [place] s?c kh?e ?? ki?m sot cn n?ng. ?i?u ny c th? bao g?m h?n ch? v? ch? ?? ?n.  Khng s? d?ng b?t k? s?n ph?m no ch?a nicotine ho?c thu?c l, ch?ng ha?n nh? thu?c l d?ng ht v thu?c l ?i?n t?. Nh?ng s?n ph?m ny c th? lm ch?m qu trnh lnh c?a x??ng. N?u qu v? c?n gip ?? ?? cai thu?c, hy h?i chuyn gia ch?m [place] s?c kh?e.  S? d?ng d?ng c? h? tr? theo ch? d?n c?a chuyn gia ch?m [place] s?c kh?e.  Tun th? t?t c? cc cu?c h?n khm l?i theo ch? d?n c?a chuyn gia ch?m [place] s?c kh?e. ?i?u ny c vai tr quan tr?ng. N?i ?? tm thm thng tin:  [employer] of Arthritis and Musculoskeletal and Skin Diseases (Vi?n Vim kh?p v C? x??ng v B?nh da Qu?c gia): [email or web address]  [employer] on Aging (Vi?n Lo khoa Qu?c gia): [email or web address]  American College of Rheumatology ([place] Th?p kh?p M?): [email or web address] Hy lin l?c v?i chuyn gia ch?m [place] s?c kh?e n?u:  Da c?a qu v? chuy?n sang mu ??Sander Nephew v? b? pht  ban.  Qu v? b? ?au tr?m tr?ng h?n.  Qu v? b? s?t km v?i ?au kh?p ho?c ?au c?. Yu c?u tr? gip ngay l?p t?c n?u:  Qu v? b? s?t cn nhi?u.  Qu v? ??t ng?t b? m?t c?m gic ngon mi?ng.  Qu v? ?? m? hi ban ?m. Tm t?t  Vim x??ng kh?p l m?t lo?i vim kh?p ?nh h??ng ??n m bao b?c cc ??u mt c?a x??ng trong kh?p (s?n).  Tnh tr?ng ny l do s? mn d?n theo tu?i tc c?a s?n bao b?c cc ??u mt c?a x??ng.  Tri?u ch?ng chnh c?a tnh tr?ng ny l ?au, s?ng v c?ng ? kh?p.  Khng c cch tr? d?t ?i?m tnh tr?ng ny, nh?ng ?i?u tr? c th? gip ki?m sot c?n ?au v c?i thi?n ch?c n?ng kh?p. Thng tin ny khng nh?m m?c ?ch thay th? cho l?i  khuyn m chuyn gia ch?m [place] s?c kh?e ni v?i qu v?. Hy b?o ??m qu v? ph?i th?o lu?n b?t k? v?n ?? g m qu v? c v?i chuyn gia ch?m [place] s?c kh?e c?a qu v?. Document Released: 03/31/2005 Document Revised: 02/25/2016 Document Reviewed: 12/06/2012 Elsevier Interactive Patient Education  2018 [place] v? c?n bi?t g v? vim x??ng kh?p What You Need to Know About Osteoarthritis Vim x??ng kh?p l m?t lo?i vim kh?p ?nh h??ng ??n m bao b?c cc ??u mt c?a x??ng trong kh?p (s?n). S?n ?ng vai tr l l?p ??m gi?a cc x??ng v gip x??ng c? ??ng tr?n tru. Vim x??ng kh?p x?y ra khi s?n trong cc kh?p b? mn. ?i khi, vim x??ng kh?p ???c g?i l vim kh?p "mn v rch". Vim x??ng kh?p c th? ?nh h??ng ??n b?t c? kh?p no v c th? gy ?au ??n khi c? ??ng. Hng, ??u g?i, ngn tay v ngn chn l m?t s? kh?p hay b? ?nh h??ng nh?t b?i vim x??ng kh?p. Qu v? c th? d? b? vim x??ng kh?p h?n n?u:  Qu v? ? tu?i trung nin tr? ln.  Qu v? b? bo ph.  Qu v? b? t?n th??ng m?t kh?p ho?c ? t?ng ph?u thu?t kh?p.  Qu v? c ti?n s? gia ?nh b? vim x??ng kh?p.  Vim x??ng kh?p c th? ?nh h??ng ??n ti nh? th? no? Vim x??ng kh?p c th? gy:  ?au ho?c s?ng n? trong kh?p.  Kh c? ??ng kh?p.  C?m gic rt ho?c c? xt trong kh?p khi  c? ??ng.  C ti?ng "l?c c?c" ho?c "l?o x?o" khi c? ??ng.  Tnh tr?ng ny c th? khi?n kh kh?n h?n khi lm nh?ng vi?c m qu v? c?n ho?c mu?n lm m?i ngy. Vim x??ng kh?p trong m?t kh?p l?n, nh? l ??u g?i ho?c hng, c th? gy ?au ??n khi ?i l?i ho?c t?p th? d?c. N?u b? vim x??ng kh?p ? bn tay, qu v? c kh? n?ng khng th? c?m n?m, v?n bn tay ho?c ki?m sot nh?ng c? ??ng nh? c?a bn tay v ngn tay (k? n?ng v?n ??ng tinh). Theo th?i gian, vim x??ng kh?p c th? khi?n qu v? t v?n ??ng c? th? h?n. t v?n ??ng s? lm t?ng nguy c? b? cc v?n ?? s?c kh?e lu di (m?n tnh), ch?ng h?n nh? b?nh ti?u ???ng v b?nh tim. C th? th?c hi?n nh?ng thay ??i no v? l?i s?ng? Qu v? c th? gi?m b?t tc ??ng c?a vim x??ng kh?p ln cu?c s?ng hng ngy b?ng cch:  Chuy?n sang cc ho?t ??ng t gy tc ??ng khng t?o p l?c l?p ?i l?p l?i ln cc kh?p. V d?: n?u qu v? th??ng ch?y ho?c ?i ch?m ?? luy?n t?p, hy th? thay b?ng b?i ho?c ??p xe.  Tch c?c ho?t ??ng. Xy d?ng t nh?t 150 pht ho?t ??ng th? ch?t m?i tu?n ?? gi? cho cc kh?p kh?e m?nh v gi? cho thn th? c??ng trng.  Gi?m cn. N?u qu v? th?a cn ho?c bo ph, gi?m cn c th? gi?m b?t p l?c ln cc kh?p. N?u qu v? c?n gip gi?m cn, hy trao ??i v?i chuyn gia ch?m [place] s?c kh?e ho?c m?t chuyn gia v? ch? ?? ?n v dinh d??ng (chuyn gia dinh d??ng).  C th? th?c hi?n nh?ng thay ??i no khc? Qu v? c?ng c th? gi?m b?t ?nh h??ng c?a vim x??ng kh?p b?ng cch:  S? d?ng d?ng c? h? tr?. ?i khi m?t chi?c n?ng, b?ng qu?n, n?p, g?ng tay chuyn d?ng ho?c cy g?y c th? tr? gip. Trao ??i v?i chuyn gia ch?m [place] s?c kh?e ho?c bc s? v?t l tr? li?u xem khi no s? d?ng v cch s? d?ng nh?ng d?ng c? ?.  Lm vi?c v?i bc s? v?t l tr? li?u c th? gip qu v? tm cch th?c hi?n sinh ho?t hng ngy m khng gy t?n th??ng cho kh?p. Bc s? v?t l tr? li?u c?ng c th? h??ng d?n qu v? cc bi t?p th? d?c v ko gin c? ?? t?ng c??ng s?c m?nh cc c? nng ??  kh?p.  ?i?u tr? ?au v vim. Ch? s? d?ng thu?c khng k ??n v thu?c k ??n ?? tr? ?au v vim theo ch? d?n c?a chuyn gia ch?m [place] s?c kh?e. N?u ???c ch? d?n, qu v? c th? ch??m ? ln kh?p b? ?nh h??ng: ? N?u qu v? dng d?ng c? h? tr? tho ra ???c, hy tho d?ng c? ? theo ch? d?n c?a chuyn gia ch?m [place] s?c kh?e. ? Cho ? l?nh vo ti ni lng. ? ?? kh?n t?m ? gi?a da v ti ch??m. ? Ch??m ? l?nh trong 20 pht, 2-3 l?n m?i ngy.  N?u cc bi?n php khc khng c hi?u qu?, qu v? c th? c?n ph?u thu?t kh?p, ch?ng h?n nh? thay kh?p. ?i?u g c th? x?y ra n?u khng thay ??i? Vim x??ng kh?p l tnh tr?ng tr? nn tr?m tr?ng h?n theo th?i gian (tnh tr?ng ti?n tri?n). N?u qu v? khng th?c hi?n cc b??c t?ng c??ng c? th? v lm ch?m qu trnh ti?n tri?n c?a b?nh, tnh tr?ng c?a qu v? c th? b? tr?m tr?ng nhanh h?n. Cc kh?p c?a qu v? c th? b? c?ng v b? s?ng, ?i?u ny s? khi?n cc kh?p ? ?au v kh c? ??ng. N?i ?? tm thm thng tin: Qu v? c th? tm hi?u thm v? vim x??ng kh?p ?:  Qu? h? tr? Vim kh?p: [email or web address]  Vi?n Vim kh?p v B?nh C? x??ng kh?p v B?nh v? da Qu?c gia: [email or web address]  Hy lin l?c v?i chuyn gia ch?m [place] s?c kh?e n?u:  Qu v? khng th? th?c hi?n sinh ho?t bnh th??ng m?t cch tho?i mi.  Kh?p c?a qu v? khng ho?t ??ng.  Tnh tr?ng ?au c?a qu v? gy ?nh h??ng ??n gi?c ng?.  Qu v? t?ng cn.  Kh?p c?a qu v? c d?u hi?u thay ??i hnh d?ng, thay v ch? s?ng v ?au. Tm t?t  Vim x??ng kh?p l b?nh kh?p ?au ??n tr? nn tr?m tr?ng h?n theo th?i gian.  Tnh tr?ng ny c th? d?n ??n cc v?n ?? s?c kh?e lu di (m?n tnh).  C nhi?u thay ??i qu v? c th? th?c hi?n ?? lm ch?m qu trnh ti?n tri?n b?nh. Thng tin ny khng nh?m m?c ?ch thay th? cho l?i khuyn m chuyn gia ch?m [place] s?c kh?e ni v?i qu v?. Hy b?o ??m qu v? ph?i th?o lu?n b?t k? v?n ?? g m qu  v? c v?i chuyn gia ch?m [place] s?c kh?e c?a qu v?. Document Released: 07/18/2016 Document Revised: 07/18/2016 Elsevier Interactive Patient Education  2018 [place] v? c?n bi?t g v? ?au l?ng m?n tnh What You Need to Know About Chronic Back Pain ?au l?ng trong th?i gian di (m?n tnh) l ?au l?ng ko di t? 12 tu?n tr? ln. Tnh tr?ng  ny th??ng ?nh h??ng ??n l?ng d??i v c th? t? m?c ?? nh? ??n n?ng. Nhi?u ng??i b? ?au l?ng ? m?t th?i ?i?m no ? trong ??i. M?i ng??i c th? c?m th?y ?au khc nhau. ?au l?ng c th? c?m th?y gi?ng nh? ?au nh?c c? ho?c ?au bu?t, nhi. Tnh tr?ng ?au th??ng tr? nn tr?m tr?ng h?n theo th?i gian. C th? kh tm ra nguyn nhn gy ?au l?ng m?n tnh. ?i?u tr? ?au l?ng m?n tnh th??ng b?t ??u b?ng cch ngh? ng?i v gi?m ?au, sau ? l t?p luy?n (v?t l tr? li?u) ?? c?ng c? cc c? nng ?? l?ng. Qu v? c th? ph?i th? nhi?u cch khc nhau ?? tm ra cch c tc d?ng t?t nh?t v?i mnh. N?u cc ph??ng php ?i?u tr? khc khng c k?t qu? ho?c n?u tnh tr?ng ?au c?a qu v? do m?t b?nh l ho?c t?n th??ng gy ra, qu v? c th? c?n ph?u thu?t. ?au l?ng c th? ?nh h??ng ??n ti nh? th? no? ?au l?ng m?n tnh gy c?m gic khng tho?i mi v c th? khi?n cho qu v? kh th?c hi?n sinh ho?t th??ng ngy. ?au l?ng m?n tnh c th?:  Gy t v ?au bu?t.  ??n r?i ?i.  Tr? nn tr?m tr?ng h?n khi qu v? ng?i, ??ng, ?i l?i, ci ho?c nng nh?c.  ?nh h??ng ??n qu v? trong khi ho?t ??ng, ngh? ng?i ho?c c? hai.  Th?m ch khi?n qu v? kh ?i d?o xung quanh.  X?y ra km theo s?t, s?t cn ho?c ?i ti?u kh.  L?i ch c?a vi?c ?i?u tr? ?au l?ng l g? ?i?u tr? ?au l?ng m?n tnh c th?:  Gi?m ?au.  Khng cho tnh tr?ng ?au tr? nn tr?m tr?ng h?n.  Gip qu v? d? dng th?c hi?n cc sinh ho?t th??ng l? h?n.  Ti c th? th?c hi?n m?t s? b??c g ?? lm gi?m ?au l?ng?  Ch? s? d?ng thu?c khng k ??n ho?c thu?c k ??n theo ch? d?n c?a chuyn gia ch?m [place] s?c kh?e.  N?u ???c ch?  d?n, ch??m nng vo vng b? ?nh h??ng. S? d?ng ngu?n nhi?t m chuyn gia ch?m [place] s?c kh?e khuy?n ngh?, ch?ng h?n nh? ti ch??m nhi?t ?m ho?c ??m ch??m nng. ? ?? kh?n t?m ? gi?a da v ngu?n nhi?t. ? Duy tr ngu?n nhi?t trong 20-30 pht. ? B? ngu?n nhi?t ra n?u da qu v? chuy?n sang mu ?? nh?t. ?i?u ny ??c bi?t quan tr?ng n?u qu v? khng th? c?m th?y ?au, nng, hay l?nh. Qu v? c th? c nguy c? b? b?ng cao h?n.  N?u ???c ch? d?n, hy ch??m ? ln vng b? ?nh h??ng: ? Cho ? l?nh vo ti ni lng. ? ?? kh?n t?m ? gi?a da v ti ch??m. ? Ch??m ? l?nh trong 20 pht, 2-3 l?n m?i ngy.  T?p th? d?c th??ng xuyn theo ch? d?n c?a chuyn gia ch?m  s?c kh?e ?? c?i thi?n m?c ?? linh ho?t v s?c m?nh.  Khng ht thu?c.  Duy tr m??c cn n?ng c l?i cho s?c kh?e.  Khi nng nh?c ?? v?t: ? Gi? hai bn chn ? kho?ng cch gi?ng nh? hai vai (dang r?ng b?ng vai) ho?c cch xa h?n. ? Si?t ch?t cc c? b?ng. ? G?p hai ??u g?i v hng v gi? x??ng s?ng ? v? tr trung gian. ?i?u quan tr?ng l nng b?ng s?c m?nh c?a hai chn ch? khng ph?i l l?ng.  Khng kha th?ng hai ??u g?i. ? Lun nh? tr? gip khi nng v?t n?ng ho?c c?ng k?nh. ?i?u g c th? x?y ra n?u tnh tr?ng ?au l?ng c?a ti khng ???c ?i?u tr?? ?au l?ng khng ?i?u tr? c th?:  Tr? nn tr?m tr?ng h?n theo th?i gian.  B?t ??u x?y ra th??ng xuyn h?n ho?c ? nh?ng th?i ?i?m khc nhau, ch?ng h?n nh? qu v? ngh? ng?i.  Gy ra v?n ?? v? t? th?.  Khi?n qu v? kh di chuy?n xung quanh (h?n ch? v?n ??ng).  Ti c th? nh?n h? tr? ? ?u? ?au l?ng m?n tnh c th? l tnh tr?ng r?t kh x? tr. C th? c ch n?u trao ??i v?i ng??i khc c kinh nghi?m t??ng t?. Cn nh?c tham gia m?t nhm h? tr? cho nh?ng ng??i ?ang ??i ph v?i ?au l?ng m?n tnh. Hy h?i chuyn gia ch?m [place] s?c kh?e v? cc nhm h? tr? trong khu v?c c?a qu v?. Qu v? c?ng c th? tm cc nhm h? tr? tr?c tuy?n ho?c c nhn thng qua:  H?i ?au M?n tnh M?:  [email or web address]  Qu? b?o tr? Ng??i b? ?au b?nh [place]?: [email or web address]  Hy lin l?c v?i chuyn gia ch?m [place] s?c kh?e n?u:  Cc tri?u ch?ng c?a qu v? khng ?? h?n ho?c tr? nn tr?m tr?ng h?n.  Qu v? b? ?au l?ng r?t nhi?u.  Qu v? b? ?au l?ng m?n tnh v s?t.  Qu v? b? s?t cn m khng c? g??ng gi?m cn.  Qu v? ti?u ti?n kh.  Qu v? b? t ho?c ?au bu?t.  Qu v? b? tnh tr?ng ?au m?i sau m?t t?n th??ng. Tm t?t  ?au l?ng m?n tnh th??ng ???c ?i?u tr? b?ng cch ngh? ng?i, gi?m ?au v v?t l tr? li?u.  T?p th? d?c th??ng xuyn ?? c?i thi?n s?c m?nh v s? linh ho?t.  Ch???m no?ng ho?c l?nh ln vu?ng bi? a?nh h??ng theo chi? d?n cu?a chuyn gia ch?m so?c s??c kho?e.  ?au l?ng m?n tnh c th? l m?t thch th?c khi s?ng chung v?i n. Tham gia m?t nhm h? tr? c th? gip qu v? qu?n l tnh tr?ng c?a mnh. Thng tin ny khng nh?m m?c ?ch thay th? cho l?i khuyn m chuyn gia ch?m [place] s?c kh?e ni v?i qu v?. Hy b?o ??m qu v? ph?i th?o lu?n b?t k? v?n ?? g m qu v? c v?i chuyn gia ch?m [place] s?c kh?e c?a qu v?. Document Released: 07/18/2016 Document Revised: 07/18/2016 Elsevier Interactive Patient Education  [employer].

## 2017-10-01 NOTE — Progress Notes (Signed)
Subjective:  By signing my name below, I, Lynn Fleming, attest that this documentation has been prepared under the direction and in the presence of Delman Cheadle, MD Electronically Signed: Ladene Artist, ED Scribe 10/01/2017 at 9:32 AM.   Patient ID: Lynn Fleming, female    DOB: Sep 01, 1955, 62 y.o.   MRN: [id number]  Chief Complaint  Patient presents with  . Medication Reaction    flexeral and mobic- causes dry mouth, dry throat, drowsiness and poor appetite  . Back Pain    joint pain in arms hands and knees   HPI Lynn Fleming is a 62 y.o. female who presents to [facility] at [place]. [employer] (ID #: [id number]) - [place].  Joint Pain Pt reports pain in her back, arms, hands and knees for sev months. She reports that back pain is the worst. Pt was prescribed Flexeril and Mobic which she suspects she is having a reaction to. She reports side-effects of dry mouth, dry throat, drowsiness and decreased appetite. She stretches sev times/day but doesn't necessarily exercise. Pt has not tried PT.  Past Medical History:  Diagnosis Date  . Hepatitis B   . Hypertension    Current Outpatient Medications on File Prior to Visit  Medication Sig Dispense Refill  . aspirin 81 MG chewable tablet Chew 1 tablet (81 mg total) by mouth daily.    Marland Kitchen atorvastatin (LIPITOR) 20 MG tablet Take 1 tablet (20 mg total) by mouth daily. 90 tablet 3  . cetirizine (ZYRTEC) 10 MG tablet Take 1 tablet (10 mg total) by mouth daily. 90 tablet 3  . lisinopril (PRINIVIL,ZESTRIL) 20 MG tablet Take 1 tablet (20 mg total) by mouth daily. 90 tablet 3  . metFORMIN (GLUCOPHAGE-XR) 750 MG 24 hr tablet Take 1 tablet (750 mg total) by mouth 2 (two) times daily. (Patient taking differently: Take 750 mg by mouth daily with breakfast. ) 180 tablet 3  . omeprazole (PRILOSEC) 20 MG capsule Take 20 mg by mouth daily.    . cyclobenzaprine (FLEXERIL) 5 MG tablet Take 1 tablet (5 mg total) by mouth 3 (three) times daily  as needed. (Patient not taking: Reported on 10/01/2017) 90 tablet 5  . meloxicam (MOBIC) 7.5 MG tablet Take 1 tablet (7.5 mg total) by mouth daily. (Patient not taking: Reported on 10/01/2017) 30 tablet 5   No current facility-administered medications on file prior to visit.    Past Surgical History:  Procedure Laterality Date  . ESOPHAGOGASTRODUODENOSCOPY N/A 09/30/2013   Procedure: ESOPHAGOGASTRODUODENOSCOPY (EGD);  Surgeon: Winfield Cunas., MD;  Location: [facility];  Service: Endoscopy;  Laterality: N/A;  . EUS N/A 11/09/2013   Procedure: ESOPHAGEAL ENDOSCOPIC ULTRASOUND (EUS) RADIAL;  Surgeon: Arta Silence, MD;  Location: [facility];  Service: Endoscopy;  Laterality: N/A;   Allergies  Allergen Reactions  . Diclofenac     Abdominal pain   No family history on file. Social History   Socioeconomic History  . Marital status: Married    Spouse name: Not on file  . Number of children: Not on file  . Years of education: Not on file  . Highest education level: Not on file  Occupational History  . Not on file  Social Needs  . Financial resource strain: Not on file  . Food insecurity:    Worry: Not on file    Inability: Not on file  . Transportation needs:    Medical: Not on file    Non-medical: Not on file  Tobacco  Use  . Smoking status: Never Smoker  . Smokeless tobacco: Never Used  Substance and Sexual Activity  . Alcohol use: No  . Drug use: No  . Sexual activity: Not Currently  Lifestyle  . Physical activity:    Days per week: Not on file    Minutes per session: Not on file  . Stress: Not on file  Relationships  . Social connections:    Talks on phone: Not on file    Gets together: Not on file    Attends religious service: Not on file    Active member of club or organization: Not on file    Attends meetings of clubs or organizations: Not on file    Relationship status: Not on file  Other Topics Concern  . Not on file  Social History Narrative  . Not on  file   Depression screen [facility] 2/9 12/28/2017 10/06/2017 10/01/2017 08/22/2017 08/18/2017  Decreased Interest 0 0 0 0 0  Down, Depressed, Hopeless 0 0 0 0 0  PHQ - 2 Score 0 0 0 0 0  Altered sleeping - - - - -  Tired, decreased energy - - - - -  Change in appetite - - - - -  Feeling bad or failure about yourself  - - - - -  Trouble concentrating - - - - -  Moving slowly or fidgety/restless - - - - -  Suicidal thoughts - - - - -  PHQ-9 Score - - - - -     Review of Systems  Musculoskeletal: Positive for arthralgias and back pain.      Objective:   Physical Exam  Constitutional: She is oriented to person, place, and time. She appears well-developed and well-nourished. No distress.  HENT:  Head: Normocephalic and atraumatic.  Eyes: Conjunctivae and EOM are normal.  Neck: Neck supple. No tracheal deviation present.  Cardiovascular: Normal rate.  Pulmonary/Chest: Effort normal. No respiratory distress.  Musculoskeletal: Normal range of motion.  Neurological: She is alert and oriented to person, place, and time.  Skin: Skin is warm and dry.  Psychiatric: She has a normal mood and affect. Her behavior is normal.  Nursing note and vitals reviewed.  BP 130/78 (BP Location: Left Arm, Patient Position: Sitting, Cuff Size: Normal)   Pulse 82   Temp 98.1 F (36.7 C) (Oral)   Ht 5' 1.42" (1.56 m)   Wt 134 lb 6.4 oz (61 kg)   SpO2 97%   BMI 25.05 kg/m     Assessment & Plan:   1. Primary osteoarthritis involving multiple joints      Meds ordered this encounter  Medications  . DISCONTD: diclofenac (VOLTAREN) 75 MG EC tablet    Sig: Take 1 tablet (75 mg total) by mouth 2 (two) times daily. As needed for back and joint pain    Dispense:  60 tablet    Refill:  2  . diclofenac (VOLTAREN) 75 MG EC tablet    Sig: Take 1 tablet (75 mg total) by mouth 2 (two) times daily. As needed for back and joint pain    Dispense:  60 tablet    Refill:  5   I personally performed the services  described in this documentation, which was scribed in my presence. The recorded information has been reviewed and considered, and addended by me as needed.   Delman Cheadle, M.D.  Primary Care at [facility] [address] [place], [place] [address] [phone] phone [phone] fax  01/24/18 3:29 PM

## 2017-10-06 ENCOUNTER — Ambulatory Visit: Payer: Self-pay | Admitting: *Deleted

## 2017-10-06 ENCOUNTER — Ambulatory Visit: Payer: Self-pay | Admitting: Physician Assistant

## 2017-10-06 ENCOUNTER — Other Ambulatory Visit: Payer: Self-pay

## 2017-10-06 ENCOUNTER — Encounter: Payer: Self-pay | Admitting: Physician Assistant

## 2017-10-06 VITALS — BP 125/74 | HR 74 | Temp 98.1°F | Resp 16 | Ht 61.42 in | Wt 134.8 lb

## 2017-10-06 DIAGNOSIS — M545 Low back pain, unspecified: Secondary | ICD-10-CM

## 2017-10-06 DIAGNOSIS — R1013 Epigastric pain: Secondary | ICD-10-CM

## 2017-10-06 DIAGNOSIS — G8929 Other chronic pain: Secondary | ICD-10-CM

## 2017-10-06 MED ORDER — DICLOFENAC SODIUM 1 % TD GEL: 2.0000 g | Freq: Four times a day (QID) | TRANSDERMAL | 1 refills | Status: DC

## 2017-10-06 NOTE — Progress Notes (Signed)
Lynn Fleming  MRN: [id number] DOB: Nov 12, 1955  PCP: Jaynee Eagles, PA-C  Subjective:  Pt is a 62 year old female who presents to clinic for abdominal pain x 1 day. She speaks [place] and is here with an interpreter. Pain is located in her epigastric area and started this morning. She started taking Diclofenac two days ago - she has been taking one pill bid. Symptoms started early this morning. Felt "squeezing". She has not taken the medication this morning.  She ate soup today, this was fine. She feels "uncomfortable".  Last BM this morning.  Denies n/v, chill, blood in stool, constipation, hematuria, urinary symptoms.   Review of Systems  Constitutional: Negative for chills, diaphoresis, fatigue and fever.  Gastrointestinal: Positive for abdominal pain. Negative for constipation, diarrhea, nausea and vomiting.  Musculoskeletal: Positive for back pain. Negative for gait problem.  Neurological: Negative for weakness and numbness.    Patient Active Problem List   Diagnosis Date Noted  . Depression 03/15/2016  . Benign essential HTN 03/15/2016  . Scoliosis of lumbar spine 03/15/2016  . Degenerative disc disease, lumbar 03/15/2016  . Headache 12/12/2013  . TIA (transient ischemic attack) 12/12/2013    Current Outpatient Medications on File Prior to Visit  Medication Sig Dispense Refill  . aspirin 81 MG chewable tablet Chew 1 tablet (81 mg total) by mouth daily.    Marland Kitchen atorvastatin (LIPITOR) 20 MG tablet Take 1 tablet (20 mg total) by mouth daily. 90 tablet 3  . cetirizine (ZYRTEC) 10 MG tablet Take 1 tablet (10 mg total) by mouth daily. 90 tablet 3  . diclofenac (VOLTAREN) 75 MG EC tablet Take 1 tablet (75 mg total) by mouth 2 (two) times daily. As needed for back and joint pain 60 tablet 5  . lisinopril (PRINIVIL,ZESTRIL) 20 MG tablet Take 1 tablet (20 mg total) by mouth daily. 90 tablet 3  . metFORMIN (GLUCOPHAGE-XR) 750 MG 24 hr tablet Take 1 tablet (750 mg total) by mouth  2 (two) times daily. (Patient taking differently: Take 750 mg by mouth daily with breakfast. ) 180 tablet 3  . omeprazole (PRILOSEC) 20 MG capsule Take 20 mg by mouth daily.     No current facility-administered medications on file prior to visit.     No Known Allergies   Objective:  BP 125/74   Pulse 74   Temp 98.1 F (36.7 C)   Resp 16   Ht 5' 1.42" (1.56 m)   Wt 134 lb 12.8 oz (61.1 kg)   SpO2 97%   BMI 25.12 kg/m   Physical Exam  Constitutional: She is oriented to person, place, and time. No distress.  Abdominal: Soft. Normal appearance and bowel sounds are normal. There is tenderness (mild, left). There is no rigidity and no guarding.  Musculoskeletal:       Thoracic back: She exhibits tenderness. She exhibits no bony tenderness.       Lumbar back: She exhibits tenderness. She exhibits no bony tenderness.  Neurological: She is alert and oriented to person, place, and time.  Skin: Skin is warm and dry.  Psychiatric: Judgment normal.  Vitals reviewed.   Assessment and Plan :  1. Epigastric pain 2. Chronic left-sided low back pain without sciatica -Patient presents with epigastric pain for the last 2 days.  Recently started diclofenac for back pain.  Suspect abdominal pain secondary to side effect of diclofenac.  Allergy list updated.  She will stop diclofenac.  Advised heat and/or ice, foam roller.  Voltaren gel prescribed.  She will return for dry needling if needed.   Mercer Pod, PA-C  [facility] [place] [facility] 10/06/2017 2:54 PM

## 2017-10-06 NOTE — Telephone Encounter (Signed)
Interrupter 'Tammy' on line for patient.  Pt reports intermittent pain "Upper abdomen, between ribs." States radiates to back at times. Onset 0500 this am. Reports episodes last 30 secs, 9/10 when occurs. Denies any nausea, vomiting, diarrhea. "Like cramping." Also reports "Bloating." LBM this am. States "Feels better since taking Prilosec this am." Pt states concerned about new medication she has been taking; Voltaren RX 10/01/17. Pt requesting to be seen today to have medications evaluated. Appt made with W. McVey for this afternoon. Care advise given per protocol.  Reason for Disposition . Age > 60 years  Answer Assessment - Initial Assessment Questions 1. LOCATION: "Where does it hurt?"      Upper abdomen , between ribs 2. RADIATION: "Does the pain shoot anywhere else?" (e.g., chest, back)    Back at times 3. ONSET: "When did the pain begin?" (e.g., minutes, hours or days ago)      This am 0500 4. SUDDEN: "Gradual or sudden onset?"     Gradual 5. PATTERN "Does the pain come and go, or is it constant?"    - If constant: "Is it getting better, staying the same, or worsening?"      (Note: Constant means the pain never goes away completely; most serious pain is constant and it progresses)     - If intermittent: "How long does it last?" "Do you have pain now?"     (Note: Intermittent means the pain goes away completely between bouts)     Intermittent, lasts 30 seconds, cramping 6. SEVERITY: "How bad is the pain?"  (e.g., Scale 1-10; mild, moderate, or severe)   - MILD (1-3): doesn't interfere with normal activities, abdomen soft and not tender to touch    - MODERATE (4-7): interferes with normal activities or awakens from sleep, tender to touch    - SEVERE (8-10): excruciating pain, doubled over, unable to do any normal activities      9/10 when occurs 7. RECURRENT SYMPTOM: "Have you ever had this type of abdominal pain before?" If so, ask: "When was the last time?" and "What happened that  time?"      Yes, 3 years ago, "Stomach inflamed." 8. CAUSE: "What do you think is causing the abdominal pain?"     Possibly new medication, voltaren 9. RELIEVING/AGGRAVATING FACTORS: "What makes it better or worse?" (e.g., movement, antacids, bowel movement)     RX med 10. OTHER SYMPTOMS: "Has there been any vomiting, diarrhea, constipation, or urine problems?"       no  Protocols used: ABDOMINAL PAIN - [facility]

## 2017-10-06 NOTE — Patient Instructions (Addendum)
Ng?ng dng Diclofenac. ?i?u ny r?t c th? gy ra ?au l?ng c?a b?n. B?t ??u p d?ng gel Voltaren trn l?ng c?a b?n.  mua m?t con l?n b?t v s? d?ng n trn l?ng c?a b?n  th? p d?ng nhi?t v / ho?c ? vo l?ng  Theo di v?i bc s? Riley Nearing ?au l?ng c?a b?n.  Hy quay l?i v g?p ti n?u b?n mu?n kh c?n.   ---------- Stop taking Diclofenac. This is most likely causing your back pain.  Start applying Voltaren gel on your back.  buy a foam roller and use this on your back Follow-up with Dr. Brigitte Pulse for your back pain.   Come back and see me if you would like dry needling.   Thank you for coming in today. I hope you feel we met your needs.  Feel free to call PCP if you have any questions or further requests.  Please consider signing up for MyChart if you do not already have it, as this is a great way to communicate with me.  Best,  Lynn Pod, PA-C] IF you received an x-ray today, you will receive an invoice from [facility] Radiology. Please contact [facility] Radiology at [phone] with questions or concerns regarding your invoice.   IF you received labwork today, you will receive an invoice from [place]. Please contact [facility] at [phone] with questions or concerns regarding your invoice.   Our billing staff will not be able to assist you with questions regarding bills from these companies.  You will be contacted with the lab results as soon as they are available. The fastest way to get your results is to activate your My Chart account. Instructions are located on the last page of this paperwork. If you have not heard from [place] regarding the results in 2 weeks, please contact this office.

## 2017-11-20 ENCOUNTER — Telehealth: Payer: Self-pay | Admitting: Urgent Care

## 2017-11-20 NOTE — Telephone Encounter (Signed)
Called pt to let them know we would need to reschedule their appt 01/01/18. Rescheduled

## 2017-12-28 ENCOUNTER — Encounter: Payer: Self-pay | Admitting: Urgent Care

## 2017-12-28 ENCOUNTER — Ambulatory Visit: Payer: Self-pay | Admitting: Urgent Care

## 2017-12-28 VITALS — BP 131/78 | HR 86 | Temp 98.2°F | Resp 16 | Ht 61.0 in | Wt 132.0 lb

## 2017-12-28 DIAGNOSIS — M545 Low back pain, unspecified: Secondary | ICD-10-CM

## 2017-12-28 DIAGNOSIS — G894 Chronic pain syndrome: Secondary | ICD-10-CM

## 2017-12-28 DIAGNOSIS — M5136 Other intervertebral disc degeneration, lumbar region: Secondary | ICD-10-CM

## 2017-12-28 DIAGNOSIS — G8929 Other chronic pain: Secondary | ICD-10-CM

## 2017-12-28 DIAGNOSIS — M51369 Other intervertebral disc degeneration, lumbar region without mention of lumbar back pain or lower extremity pain: Secondary | ICD-10-CM

## 2017-12-28 DIAGNOSIS — M8588 Other specified disorders of bone density and structure, other site: Secondary | ICD-10-CM

## 2017-12-28 DIAGNOSIS — M4180 Other forms of scoliosis, site unspecified: Secondary | ICD-10-CM

## 2017-12-28 MED ORDER — TRAMADOL-ACETAMINOPHEN 37.5-325 MG PO TABS: 1.0000 | ORAL_TABLET | Freq: Three times a day (TID) | ORAL | 0 refills | Status: DC | PRN

## 2017-12-28 MED ORDER — CYCLOBENZAPRINE HCL 5 MG PO TABS: 5.0000 mg | ORAL_TABLET | Freq: Three times a day (TID) | ORAL | 1 refills | Status: DC | PRN

## 2017-12-28 MED ORDER — METHYLPREDNISOLONE ACETATE 80 MG/ML IJ SUSP
40.0000 mg | Freq: Once | INTRAMUSCULAR | Status: AC
  Administered 2017-12-28: 40 mg via INTRAMUSCULAR

## 2017-12-28 NOTE — Progress Notes (Signed)
    MRN: [id number] DOB: 02/21/1956  Subjective:   Lynn Fleming is a 62 y.o. female presenting for follow up on chronic back pain. Has had back pain for ~15 years, history of osteophytic changes, scoliosis, osteopenia. Last x-ray was 08/18/2017. Has been using otc medications which relieve her pain but cause stomach upset. She is hoping to get a different kind of medication today. Pain has gotten worse in the past 2 years. She has been using topical medication recently with only moderate relief. Denies falls, trauma, incontinence, hematuria, weakness, tingling.  reports that she has never smoked. She has never used smokeless tobacco. She reports that she does not drink alcohol or use drugs.   Lynn Fleming has a current medication list which includes the following prescription(s): aspirin, atorvastatin, cetirizine, diclofenac, lisinopril, metformin, and omeprazole. Also is allergic to diclofenac.  Lynn Fleming  has a past medical history of Hepatitis B and Hypertension. Also  has a past surgical history that includes Esophagogastroduodenoscopy (N/A, 09/30/2013) and EUS (N/A, 11/09/2013).  Objective:   Vitals: BP 131/78   Pulse 86   Temp 98.2 F (36.8 C) (Oral)   Resp 16   Ht 5\' 1"  (1.549 m)   Wt 132 lb (59.9 kg)   SpO2 96%   BMI 24.94 kg/m   Physical Exam  Constitutional: She is oriented to person, place, and time. She appears well-developed and well-nourished.  Cardiovascular: Normal rate.  Pulmonary/Chest: Effort normal.  Musculoskeletal:       Lumbar back: She exhibits decreased range of motion and tenderness. She exhibits no bony tenderness, no swelling, no edema, no deformity, no laceration and no spasm.       Back:  Neurological: She is alert and oriented to person, place, and time.   Assessment and Plan :   Chronic bilateral low back pain without sciatica - Plan: methylPREDNISolone acetate (DEPO-MEDROL) injection 40 mg, Ambulatory referral to Neurosurgery  Degenerative disc disease,  lumbar - Plan: Ambulatory referral to Neurosurgery  Levoscoliosis - Plan: Ambulatory referral to Neurosurgery  Osteopenia of lumbar spine - Plan: Ambulatory referral to Neurosurgery  Chronic pain syndrome  Patient expressed frustration that her pain is been chronic for many many years.  I counseled her on the nature of levoscoliosis and degenerative disc disease in addition to osteopenia.  Counseled that we are to manage her pain and attempt to prevent further decline.  Will use IM Depo-Medrol today.  Patient is to use Flexeril and Tylenol going forward.  Will refer to back specialist for a consult on what her options are.  Jaynee Eagles, PA-C [facility] [phone] 12/28/2017 4:21 PM

## 2017-12-28 NOTE — Patient Instructions (Addendum)
You may take 500mg  Tylenol every 6 hours for pain and inflammation. For breakthrough pain you can use Ultracet once every 8 hours. Do not use Tylenol or Ultracet if you do not have pain. Flexeril can help relax the muscles and can be taken 3 times a day or 1-2 tablets at bedtime.     ?au l?ng, Ng??i l?n Back Pain, Adult Nhi?u ng??i l?n th?nh tho?ng b? ?au l?ng. Cc nguyn nhn ph? bi?n gy ?au l?ng bao g?m:  Dy ch?ng ho?c c? b? c?ng.  Mn v rch (thoi ha) ??a ??m c?t s?ng.  Vim kh?p.  C ?nh vo l?ng.  ?au l?ng c th? di?n ra trong th?i gian ng?n (c?p tnh) ho?c trong th?i gian di (m?n tnh). Khm th?c th?, xt nghi?m trong phng th nghi?m v nghin c?u hnh ?nh c th? ???c th?c hi?n ?? tm ra nguyn nhn gy ?au l?ng. Tun th? nh?ng h??ng d?n ny ? nh: X? tr ?au v c?ng kh?p.  Ch? s? d?ng thu?c khng k ??n v thu?c k ??n theo ch? d?n c?a chuyn gia ch?m [place] s?c kh?e.  N?u ???c chi? d?n, ha?y ch???m no?ng va?o vu?ng bi? ?au th??ng xuyn nh? chi? d?n cu?a chuyn gia ch?m so?c s??c kho?e. S? d?ng ngu?n nhi?t m chuyn gia ch?m [place] s?c kh?e khuy?n ngh?, ch?ng h?n nh? ti ch??m nhi?t ?m ho?c ??m ch??m nng. ? ?? kh?n t?m ? gi?a da v ngu?n nhi?t. ? Duy tr ngu?n nhi?t trong 20-30 pht. ? B? ngu?n nhi?t ra n?u da qu v? chuy?n sang mu ?? nh?t. ?i?u ny ??c bi?t quan tr?ng n?u qu v? khng th? c?m th?y ?au, nng, hay l?nh. Qu v? c nguy c? b? b?ng cao h?n.  N?u ???c ch? d?n, ch??m ? l?nh vo vng b? t?n th??ng: ? Cho ? l?nh vo ti ni lng. ? ?? kh?n t?m ? gi?a da v ti ch??m. ? ?? ? l?nh trong kho?ng 20 pht, 2-3 l?n m?i ngy trong 2-3 ngy ??u. Ho?t ??ng  Khng n?m l?i trn gi??ng. Ngh? ng?i qu 1-2 ngy c th? la?m ch?m vi?c ph?c h?i c?a quy? vi?.  ?i b? m?t ?o?n ng?n trn b? m?t ph?ng ngay khi c th?. C? g?ng t?ng th?i gian ?i b? m?i ngy.  Khng ng?i, la?i xe, ho?c ??ng m?t ch? qu 30 pht m?i l?n. Ng?i ho?c ??ng trong th?i gian di c th? gy p l?c ln  l?ng qu v?.  S? d?ng cc k? thu?t nng ph h?p. Khi qu v? ci xu?ng v nng ?? v?t, p d?ng cc t? th? gy t p l?c h?n ln l?ng qu v?: ? Cong ??u g?i. ? Gi?? cho v?t n??ng g?n c? th? quy? vi?. ? Trnh v?n ng??i.  T?p th? d?c th??ng xuyn theo ch? d?n c?a chuyn gia ch?m [place] s?c kh?e. T?p th? d?c s? gip l?ng qu v? h?i ph?c nhanh h?n. T?p th? d?c c?ng gip phng ng?a ch?n th??ng l?ng do lm cho c? kh?e m?nh v linh ho?t.  Chuyn gia ch?m [place] s?c kh?e c th? [place] qu v? ??n g?p bc s? v?t l tr? li?u. Bc s? ny c th? gip ??a ra m?t ch??ng trnh t?p luy?n an ton. T?p theo b?t c? bi t?p no do bc s? v?t l tr? li?u ch? d?n. L?i s?ng  Duy tr m??c cn n?ng c l?i cho s?c kh?e. Th?a cn gy a?p l??c cho l?ng quy? vi? va? la?m qu v? kh duy tri? t? th?  t?t.  Hessie Diener cc ho?t ??ng ho?c tnh hu?ng khi?n qu v? c?m th?y lo l?ng ho?c c?ng th?ng. H?c cch qu?n l lo u v c?ng th?ng. M?t cch ?? qu?n l c?ng th?ng l thng qua t?p th? d?c. C?ng th?ng v lo u lm c? c?ng h?n v c th? khi?n tnh tr?ng ?au l?ng tr?m tr?ng h?n. H??ng d?n chung  Ng? trn n?m c?ng v?i t? th? tho?i mi. C? g?ng n?m nghing v?i ??u g?i h?i cong. N?u quy? vi? n??m ng??a, ??t m?t chi?c g?i d??i ??u g?i c?a quy? vi?.  Tun th? k? ho?ch ?i?u tr? c?a qu v? theo ch? d?n c?a chuyn gia ch?m [place] s?c kh?e. ?i?u tr? ny c th? bao g?m: ? Li?u php nh?n th?c ho?c hnh vi. ? Chm c?u ho?c li?u php xoa bp. ? Thi?n ho?c yoga. Hy lin l?c v?i chuyn gia ch?m [place] s?c kh?e n?u:  Qu v? b? ?au ma? khng thuyn gi?m sau khi nghi? ng?i ho??c dng thu?c.  Quy? vi? bi? ?au t?ng ln lan xu?ng d???i chn ho?c mng.  C?n ?au c?a qu v? khng ?? trong 2 tu?n.  Quy? vi? bi? ?au va?o ban ?m.  Qu v? b? s?t cn.  Qu v? b? s?t ho?c ?n l?nh. Yu c?u tr? gip ngay l?p t?c n?u:  Quy? vi? bi? nh??ng v?n ?? m??i v? ki?m soa?t ?a?i ti?n ho??c ti?u ti?n.  Qu v? b? y?u ho??c t b b?t th???ng ?? tay ho?c  chn.  Quy? vi? bu?n nn ho?c nn.  Qu v? b? ?au b?ng.  Quy? vi? ca?m th?y bi? ng?t. Tm t?t  Nhi?u ng??i l?n th?nh tho?ng b? ?au l?ng. Khm th?c th?, xt nghi?m trong phng th nghi?m v nghin c?u hnh ?nh c th? ???c th?c hi?n ?? tm ra nguyn nhn gy ?au l?ng.  S? d?ng cc k? thu?t nng ph h?p. Khi qu v? ci xu?ng v nng, p d?ng cc t? th? gy t p l?c h?n ln l?ng c?a qu v?.  S? d?ng thu?c khng k ??n v thu?c k ??n v ch??m nng ho?c ? l?nh theo ch? d?n c?a chuyn gia ch?m [place] s?c kh?e. Thng tin ny khng nh?m m?c ?ch thay th? cho l?i khuyn m chuyn gia ch?m [place] s?c kh?e ni v?i qu v?. Hy b?o ??m qu v? ph?i th?o lu?n b?t k? v?n ?? g m qu v? c v?i chuyn gia ch?m [place] s?c kh?e c?a qu v?. Document Released: 07/23/2015 Document Revised: 08/06/2016 Document Reviewed: 08/06/2016 Elsevier Interactive Patient Education  2018 [employer].     Scoliosis Scoliosis is the name given to a spine that curves sideways.Scoliosis can cause twisting of your shoulders, hips, chest, back, and rib cage. What are the causes? The cause of scoliosis is not always known. It may be caused by a birth defect or by a disease that can cause muscular dysfunction and imbalance, such as cerebral palsy and muscular dystrophy. What increases the risk? Having a disease that causes muscle disease or dysfunction. What are the signs or symptoms? Scoliosis often has no signs or symptoms.If they are present, they may include:  Unequal size of one body side compared to the other (asymmetry).  Visible curvature of the spine.  Pain. The pain may limit physical activity.  Shortness of breath.  Bowel or bladder issues.  How is this diagnosed? A skilled health care provider will perform an evaluation. This will involve:  Taking your history.  Performing a physical examination.  Performing a neurological exam to detect nerve or muscle function loss.  Range of motion studies on the  spine.  X-rays.  An MRI may also be obtained. How is this treated? Treatment varies depending on the nature, extent, and severity of the disease. If the curvature is not great, you may need only observation. A brace may be used to prevent scoliosis from progressing. A brace may also be needed during growth spurts. Physical therapy may be of benefit. Surgery may be required. Follow these instructions at home:  Your health care provider may suggest exercises to strengthen your muscles. Perform them as directed.  Ask your health care provider before participating in any sports.  If you have been prescribed an orthopedic brace, wear it as instructed by your health care provider. Contact a health care provider if: Your brace causes the skin to become sore (chafe) or is uncomfortable. Get help right away if:  You have back pain that is not relieved by the medicines prescribed by your health care provider.  Your legs feel weak or you lose function in your legs.  You lose some bowel or bladder control. This information is not intended to replace advice given to you by your health care provider. Make sure you discuss any questions you have with your health care provider. Document Released: 03/28/2000 Document Revised: 09/06/2015 Document Reviewed: 10/04/2015 Elsevier Interactive Patient Education  [employer].     If you have lab work done today you will be contacted with your lab results within the next 2 weeks.  If you have not heard from [place] then please contact us. The fastest way to get your results is to register for My Chart.   IF you received an x-ray today, you will receive an invoice from [facility] Radiology. Please contact [facility] Radiology at [phone] with questions or concerns regarding your invoice.   IF you received labwork today, you will receive an invoice from [place]. Please contact [facility] at [phone] with questions or concerns regarding your invoice.    Our billing staff will not be able to assist you with questions regarding bills from these companies.  You will be contacted with the lab results as soon as they are available. The fastest way to get your results is to activate your My Chart account. Instructions are located on the last page of this paperwork. If you have not heard from [place] regarding the results in 2 weeks, please contact this office.

## 2018-01-01 ENCOUNTER — Ambulatory Visit: Payer: Self-pay | Admitting: Urgent Care

## 2018-01-05 ENCOUNTER — Ambulatory Visit: Payer: Self-pay | Admitting: Urgent Care

## 2018-02-23 ENCOUNTER — Ambulatory Visit: Payer: Self-pay | Admitting: Urgent Care

## 2018-02-23 ENCOUNTER — Ambulatory Visit: Payer: Self-pay | Admitting: Family Medicine

## 2018-02-24 ENCOUNTER — Ambulatory Visit: Payer: Self-pay | Admitting: Urgent Care

## 2018-03-02 ENCOUNTER — Other Ambulatory Visit: Payer: Self-pay

## 2018-03-02 ENCOUNTER — Ambulatory Visit: Payer: Self-pay | Admitting: Family Medicine

## 2018-03-02 ENCOUNTER — Encounter: Payer: Self-pay | Admitting: Family Medicine

## 2018-03-02 VITALS — BP 133/83 | HR 88 | Temp 98.0°F | Resp 17 | Ht 62.0 in | Wt 134.6 lb

## 2018-03-02 DIAGNOSIS — E1169 Type 2 diabetes mellitus with other specified complication: Secondary | ICD-10-CM

## 2018-03-02 DIAGNOSIS — E785 Hyperlipidemia, unspecified: Secondary | ICD-10-CM

## 2018-03-02 DIAGNOSIS — F5101 Primary insomnia: Secondary | ICD-10-CM

## 2018-03-02 DIAGNOSIS — R682 Dry mouth, unspecified: Secondary | ICD-10-CM

## 2018-03-02 DIAGNOSIS — I10 Essential (primary) hypertension: Secondary | ICD-10-CM

## 2018-03-02 DIAGNOSIS — K219 Gastro-esophageal reflux disease without esophagitis: Secondary | ICD-10-CM

## 2018-03-02 NOTE — Progress Notes (Signed)
Chief Complaint  Patient presents with  . recheck bp and diabetes    dysphagia and insomnia x few days    HPI  Dry mouth Patient reports that she has difficulty swallowing with sore throat and dry mouth She reports that she she has reflux as well She sleeps with her mouth open when she notices the dry throat but denies apnea She has daytime fatigue   Insomnia She has been taking nyquil which helps her to sleep Without nyquil she gets only 3-4 hours of sleep at night She denies tremors or choking episodes She has never had a sleep test  Hypertension  Denies chest pains, cough, sob, wheezing, lower extremity edema She is compliant with her medications and is consuming a DASH diet She does not take meds that worsen blood pressure She takes a daily aspirin BP Readings from Last 3 Encounters:  03/02/18 133/83  12/28/17 131/78  10/06/17 125/74   Lab Results  Component Value Date   CREATININE 0.60 07/18/2017    Diabetes Lab Results  Component Value Date   HGBA1C 6.4 (H) 07/18/2017   She reports that she sticks to a diabetic diet She takes metformin  She also is on lisinopril for hypertension  She denies dizziness, hypoglycemia or nausea  Dyslipidemia: Patient presents for evaluation of lipids.  Compliance with treatment thus far has been good.  A repeat fasting lipid profile was done.  The patient does not use medications that may worsen dyslipidemias (corticosteroids, progestins, anabolic steroids, diuretics, beta-blockers, amiodarone, cyclosporine, olanzapine). The patient exercises intermittently.  The patient is not known to have coexisting coronary artery disease.   The 10-year ASCVD risk score Mikey Bussing DC Brooke Bonito., et al., 2013) is: 15.5%   Values used to calculate the score:     Age: 62 years     Sex: Female     Is Non-Hispanic African American: No     Diabetic: Yes     Tobacco smoker: No     Systolic Blood Pressure: 024 mmHg     Is BP treated: Yes     HDL  Cholesterol: 38 mg/dL     Total Cholesterol: 189 mg/dL   Past Medical History:  Diagnosis Date  . Hepatitis B   . Hypertension     Current Outpatient Medications  Medication Sig Dispense Refill  . aspirin 81 MG chewable tablet Chew 1 tablet (81 mg total) by mouth daily.    Marland Kitchen atorvastatin (LIPITOR) 20 MG tablet Take 1 tablet (20 mg total) by mouth daily. 90 tablet 3  . cetirizine (ZYRTEC) 10 MG tablet Take 1 tablet (10 mg total) by mouth daily. 90 tablet 3  . cyclobenzaprine (FLEXERIL) 5 MG tablet Take 1 tablet (5 mg total) by mouth 3 (three) times daily as needed. 90 tablet 1  . lisinopril (PRINIVIL,ZESTRIL) 20 MG tablet Take 1 tablet (20 mg total) by mouth daily. 90 tablet 3  . metFORMIN (GLUCOPHAGE-XR) 750 MG 24 hr tablet Take 1 tablet (750 mg total) by mouth 2 (two) times daily. (Patient taking differently: Take 750 mg by mouth daily with breakfast. ) 180 tablet 3  . omeprazole (PRILOSEC) 20 MG capsule Take 20 mg by mouth daily.    . traMADol-acetaminophen (ULTRACET) 37.5-325 MG tablet Take 1 tablet by mouth every 8 (eight) hours as needed for severe pain. (Patient not taking: Reported on 03/02/2018) 30 tablet 0   No current facility-administered medications for this visit.     Allergies:  Allergies  Allergen Reactions  . Diclofenac  Abdominal pain    Past Surgical History:  Procedure Laterality Date  . ESOPHAGOGASTRODUODENOSCOPY N/A 09/30/2013   Procedure: ESOPHAGOGASTRODUODENOSCOPY (EGD);  Surgeon: Winfield Cunas., MD;  Location: [facility];  Service: Endoscopy;  Laterality: N/A;  . EUS N/A 11/09/2013   Procedure: ESOPHAGEAL ENDOSCOPIC ULTRASOUND (EUS) RADIAL;  Surgeon: Arta Silence, MD;  Location: [facility];  Service: Endoscopy;  Laterality: N/A;    Social History   Socioeconomic History  . Marital status: Married    Spouse name: Not on file  . Number of children: Not on file  . Years of education: Not on file  . Highest education level: Not on file    Occupational History  . Not on file  Social Needs  . Financial resource strain: Not on file  . Food insecurity:    Worry: Not on file    Inability: Not on file  . Transportation needs:    Medical: Not on file    Non-medical: Not on file  Tobacco Use  . Smoking status: Never Smoker  . Smokeless tobacco: Never Used  Substance and Sexual Activity  . Alcohol use: No  . Drug use: No  . Sexual activity: Not Currently  Lifestyle  . Physical activity:    Days per week: Not on file    Minutes per session: Not on file  . Stress: Not on file  Relationships  . Social connections:    Talks on phone: Not on file    Gets together: Not on file    Attends religious service: Not on file    Active member of club or organization: Not on file    Attends meetings of clubs or organizations: Not on file    Relationship status: Not on file  Other Topics Concern  . Not on file  Social History Narrative  . Not on file    No family history on file.   ROS Review of Systems See HPI Constitution: No fevers or chills No malaise No diaphoresis Skin: No rash or itching Eyes: no blurry vision, no double vision GU: no dysuria or hematuria Neuro: no dizziness or headaches  all others reviewed and negative   Objective: Vitals:   03/02/18 1242  BP: 133/83  Pulse: 88  Resp: 17  Temp: 98 F (36.7 C)  SpO2: 97%  Weight: 134 lb 9.6 oz (61.1 kg)  Height: 5' 2"  (1.575 m)    Physical Exam Physical Exam  Constitutional: He is oriented to person, place, and time. He appears well-developed and well-nourished.  HENT:  Head: Normocephalic and atraumatic.  Eyes: Conjunctivae and EOM are normal.  Cardiovascular: Normal rate, regular rhythm, normal heart sounds and intact distal pulses.  No murmur heard. Pulmonary/Chest: Effort normal and breath sounds normal. No stridor. No respiratory distress. He has no wheezes.  Neurological: He is alert and oriented to person, place, and time.  Skin: Skin  is warm. Capillary refill takes less than 2 seconds.  Psychiatric: He has a normal mood and affect. His behavior is normal. Judgment and thought content normal.     Assessment and Plan Zeena was seen today for recheck bp and diabetes.  Diagnoses and all orders for this visit:  Dyslipidemia associated with type 2 diabetes mellitus ([place])- well controlled hemoglobin a1c is at goal Continue exercise Lipids monitored and renal function in range On metformin On ace  On asa 41m Reviewed diabetic foot care Emphasized importance of eye and dental exam    -     Lipid  panel -     Hemoglobin A1c -     CMP14+EGFR  Essential hypertension- Patient's blood pressure is at goal of 139/89 or less. Condition is stable. Continue current medications and treatment plan. I recommend that you exercise for 30-45 minutes 5 days a week. I also recommend a balanced diet with fruits and vegetables every day, lean meats, and little fried foods. The DASH diet (you can find this online) is a good example of this.  -     Lipid panel -     CMP14+EGFR  Primary insomnia- advised melatonin instead of benadryl  Dry mouth- discussed that mouth breathers can get dry mouth  Gastroesophageal reflux disease without esophagitis -  Continue omeprazole    Jahi Roza A Ninfa Giannelli

## 2018-03-02 NOTE — Patient Instructions (Addendum)
Try melatonin for sleep   If you have lab work done today you will be contacted with your lab results within the next 2 weeks.  If you have not heard from [place] then please contact us. The fastest way to get your results is to register for My Chart.   IF you received an x-ray today, you will receive an invoice from [facility]. Please contact [facility] at [phone] with questions or concerns regarding your invoice.   IF you received labwork today, you will receive an invoice from [place]. Please contact [facility] at [phone] with questions or concerns regarding your invoice.   Our billing staff will not be able to assist you with questions regarding bills from these companies.  You will be contacted with the lab results as soon as they are available. The fastest way to get your results is to activate your My Chart account. Instructions are located on the last page of this paperwork. If you have not heard from [place] regarding the results in 2 weeks, please contact this office.     Phng ng?a b?nh tim Heart Disease Prevention B?nh tim l nguyn nhn hng ??u gy t? vong. C nhi?u vi?c qu v? c th? lm ?? gip phng ng?a b?nh tim. Duy tr ho?t ??ng th? ch?t Ho?t ??ng th? ch?t t?t cho tim qu v?. N gip ki?m sot huy?t p, n?ng ?? cholesterol v cn n?ng c?a qu v?. Hy c? g?ng ho?t ??ng th? ch?t m?i ngy. Hy h?i chuyn gia ch?m [place] s?c kh?e xem nh?ng ho?t ??ng no t?t nh?t cho qu v?. C cn n?ng kh?e m?nh Th?a cn c th? gy s?c p ln tim v ?nh h??ng ??n huy?t p v n?ng ?? cholesterol c?a qu v?. Hy gi?m cn b?ng ch? ?? ?n v t?p luy?n n?u chuyn gia ch?m [place] s?c kh?e c?a qu v? khuy?n ngh?. ?n cc th?c ph?m t?t cho tim [place] th? m?t k? ho?ch ?n u?ng lnh m?nh theo khuy?n ngh? c?a chuyn gia ch?m [place] s?c kh?e. Cc th?c ph?m t?t cho tim bao g?m:  Th?c ph?m nhi?u ch?t x?. Nh?ng th?c ph?m ny bao g?m cm y?n m?ch, b?t y?n m?ch v bnh m? v b?t ng? c?c t? ng? c?c  nguyn cm.  Tri cy v rau c?.  Trnh:  R??u.  Th?c ph?m chin.  Cc th?c ph?m nhi?u ch?t bo bo ha. Nh?ng th?c ph?m ny bao g?m th?t, b?, cc s?n ph?m s?a nguyn kem, m? pha bnh v d?u d?a ho?c d?u c?.  Cc th?c ph?m m?n. Nh?ng th?c ph?m ny bao g?m ?? h?p, th?t ?n tr?a, ?? ?n nh? m?n v ?? ?n nhanh.  Gi? cho n?ng ?? cholesterol c?a qu v? trong t?m ki?m sot Cholesterol l ch?t ???c s? d?ng cho nhi?u ch?c n?ng quan tr?ng. Khi n?ng ?? cholesterol c?a qu v? cao, cholesterol c th? bm vo thnh trong m?ch mu, khi?n m?ch mu b? h?p l?i ho?c t?c ngh?n. Tnh tr?ng ny c th? d?n ??n ?au ng?c (?au th?t ng?c) v nh?i mu c? tim. Gi? n?ng ?? cholesterol c?a qu v? trong t?m ki?m sot theo khuy?n ngh? c?a chuyn gia ch?m  s?c kh?e. Ki?m tra n?ng ?? cholesterol c?a qu v? t nh?t m?t l?n m?i n?m. N?ng ?? cholesterol m?c tiu (theo mg/dL) cho h?u h?t m?i ng??i l:  Cholesterol ton ph?n d??i 200.  Cholesterol LDL d??i 100.  Cholesterol HDL trn 40 ? nam gi?i v trn 50 ? n? gi?i.  Triglycerides  d??i 150.  Gi? cho huy?t p c?a qu v? trong t?m ki?m sot [place] huy?t p (t?ng huy?t p) khi?n qu v? c nguy c? b? ??t qu? v cc d?ng b?nh tim khc. Gi? huy?t p c?a qu v? trong t?m ki?m sot theo khuy?n ngh? c?a chuyn gia ch?m [place] s?c kh?e. H?i chuyn gia ch?m [place] s?c kh?e xem qu v? c c?n ?i?u tr? ?? h? huy?t p hay khng. N?u qu v? trong ?? tu?i t? 18-39 th hy ki?m tra huy?t p 3-5 n?m m?t l?n. N?u qu v? t? 38 tu?i tr? ln th hy ki?m tra huy?t p m?i n?m m?t l?n. Khng s? d?ng cc s?n ph?m thu?c l [place]hi thu?c l c th? gy t?n th??ng cho tim v m?ch mu c?a qu v?. Khng s? d?ng b?t c? cc s?n ph?m thu?c l no, bao g?m thu?c l d?ng ht, thu?c l d?ng nhai ho?c thu?c l ?i?n t?. N?u qu v? c?n gip ?? ?? cai thu?c, hy h?i chuyn gia ch?m [place] s?c kh?e. Dng thu?c theo ch? d?n Ch? s? d?ng thu?c theo ch? d?n c?a chuyn gia ch?m [place] s?c kh?e. Hy h?i chuyn gia ch?m [place] s?c kh?e xem  qu v? c c?n dng aspirin m?i ngy khng. Dng aspirin c th? gip gi?m nguy c? b? b?nh tim v ??t qu? c?a qu v?. N?i ?? tm thm thng tin: ?? tm hi?u thm v? b?nh tim, hy gh trang web [place] [email or web address] Thng tin ny khng nh?m m?c ?ch thay th? cho l?i khuyn m chuyn gia ch?m [place] s?c kh?e ni v?i qu v?. Hy b?o ??m qu v? ph?i th?o lu?n b?t k? v?n ?? g m qu v? c v?i chuyn gia ch?m [place] s?c kh?e c?a qu v?. Document Released: 07/14/2016 Document Revised: 07/14/2016 Elsevier Interactive Patient Education  [employer].

## 2018-03-03 LAB — LIPID PANEL
CHOL/HDL RATIO: 3.2 ratio (ref 0.0–4.4)
Cholesterol, Total: 120 mg/dL (ref 100–199)
HDL: 37 mg/dL — ABNORMAL LOW (ref >39)
LDL CALC: 42 mg/dL (ref 0–99)
TRIGLYCERIDES: 205 mg/dL — AB (ref 0–149)
VLDL Cholesterol Cal: 41 mg/dL — ABNORMAL HIGH (ref 5–40)

## 2018-03-03 LAB — CMP14+EGFR
A/G RATIO: 1.6 (ref 1.2–2.2)
ALT: 7 IU/L (ref 0–32)
AST: 16 IU/L (ref 0–40)
Albumin: 5 g/dL — ABNORMAL HIGH (ref 3.6–4.8)
Alkaline Phosphatase: 94 IU/L (ref 39–117)
BILIRUBIN TOTAL: 0.4 mg/dL (ref 0.0–1.2)
BUN / CREAT RATIO: 21 (ref 12–28)
BUN: 10 mg/dL (ref 8–27)
CHLORIDE: 103 mmol/L (ref 96–106)
CO2: 21 mmol/L (ref 20–29)
Calcium: 9.7 mg/dL (ref 8.7–10.3)
Creatinine, Ser: 0.47 mg/dL — ABNORMAL LOW (ref 0.57–1.00)
GFR calc non Af Amer: 105 mL/min/{1.73_m2} (ref >59)
GFR, EST AFRICAN AMERICAN: 121 mL/min/{1.73_m2} (ref >59)
Globulin, Total: 3.2 g/dL (ref 1.5–4.5)
Glucose: 88 mg/dL (ref 65–99)
POTASSIUM: 4.7 mmol/L (ref 3.5–5.2)
Sodium: 144 mmol/L (ref 134–144)
TOTAL PROTEIN: 8.2 g/dL (ref 6.0–8.5)

## 2018-03-03 LAB — HEMOGLOBIN A1C
ESTIMATED AVERAGE GLUCOSE: 146 mg/dL
Hgb A1c MFr Bld: 6.7 % — ABNORMAL HIGH (ref 4.8–5.6)

## 2018-04-24 ENCOUNTER — Encounter: Payer: Self-pay | Admitting: Family Medicine

## 2018-05-07 ENCOUNTER — Encounter: Payer: Self-pay | Admitting: Family Medicine

## 2018-05-07 ENCOUNTER — Ambulatory Visit: Payer: Self-pay | Admitting: Family Medicine

## 2018-05-07 ENCOUNTER — Other Ambulatory Visit: Payer: Self-pay

## 2018-05-07 VITALS — BP 133/80 | HR 81 | Temp 98.4°F | Resp 17 | Ht 62.0 in | Wt 130.0 lb

## 2018-05-07 DIAGNOSIS — K219 Gastro-esophageal reflux disease without esophagitis: Secondary | ICD-10-CM

## 2018-05-07 DIAGNOSIS — E119 Type 2 diabetes mellitus without complications: Secondary | ICD-10-CM

## 2018-05-07 DIAGNOSIS — F5101 Primary insomnia: Secondary | ICD-10-CM

## 2018-05-07 DIAGNOSIS — R358 Other polyuria: Secondary | ICD-10-CM

## 2018-05-07 DIAGNOSIS — R1084 Generalized abdominal pain: Secondary | ICD-10-CM

## 2018-05-07 DIAGNOSIS — I10 Essential (primary) hypertension: Secondary | ICD-10-CM

## 2018-05-07 DIAGNOSIS — R5383 Other fatigue: Secondary | ICD-10-CM

## 2018-05-07 DIAGNOSIS — R3589 Other polyuria: Secondary | ICD-10-CM

## 2018-05-07 LAB — POCT URINALYSIS DIP (MANUAL ENTRY)
Bilirubin, UA: NEGATIVE
Glucose, UA: NEGATIVE mg/dL
Ketones, POC UA: NEGATIVE mg/dL
Leukocytes, UA: NEGATIVE
Nitrite, UA: NEGATIVE
PH UA: 6.5 (ref 5.0–8.0)
PROTEIN UA: NEGATIVE mg/dL
Spec Grav, UA: <=1.005 — AB (ref 1.010–1.025)
UROBILINOGEN UA: 0.2 U/dL

## 2018-05-07 LAB — POCT CBC
Granulocyte percent: 3.8 %G — AB (ref 37–80)
HCT, POC: 36.1 % (ref 29–41)
Hemoglobin: 12.6 g/dL (ref 11–14.6)
LYMPH, POC: 35.6 — AB (ref 0.6–3.4)
MCH, POC: 32.6 pg — AB (ref 27–31.2)
MCHC: 34.9 g/dL (ref 31.8–35.4)
MCV: 93.6 fL (ref 76–111)
MID (cbc): 58.2 — AB (ref 0–0.9)
MPV: 7.4 fL (ref 0–99.8)
POC Granulocyte: 0.4 — AB (ref 2–6.9)
POC LYMPH %: 6.2 % — AB (ref 10–50)
POC MID %: 2.3 %M (ref 0–12)
Platelet Count, POC: 258 10*3/uL (ref 142–424)
RBC: 3.86 M/uL — AB (ref 4.04–5.48)
RDW, POC: 12.8 %
WBC: 6.5 10*3/uL (ref 4.6–10.2)

## 2018-05-07 LAB — GLUCOSE, POCT (MANUAL RESULT ENTRY): POC Glucose: 109 mg/dl — AB (ref 70–99)

## 2018-05-07 MED ORDER — TRAZODONE HCL 50 MG PO TABS: 50.0000 mg | ORAL_TABLET | Freq: Every evening | ORAL | 3 refills | Status: DC | PRN

## 2018-05-07 NOTE — Patient Instructions (Addendum)
Continue melatonin before bedtime with Trazodone If the melatonin does not help then stop taking You can take the trazodone every day or as needed Do not take tramadol and trazodone together If this medication works then continue  If you do not get good sleep you can increase Trazodone to 2 pills per night  Otherwise we will have to get a sleep a study   Ti?p t?c melatonin tr??c khi ?i ng? v?i Trazodone N?u melatonin khng gip th ng?ng dng B?n c th? dng trazodone m?i ngy ho?c khi c?n thi?t Khng dng tramadol v trazodone cng nhau N?u thu?c ny c tc d?ng th ti?p t?c N?u b?n khng ng? ngon, b?n c th? t?ng Trazodone ln 2 vin m?i ?m N?u khng chng ta s? ph?i ng? m?t nghin c?u   If you have lab work done today you will be contacted with your lab results within the next 2 weeks.  If you have not heard from [place] then please contact us. The fastest way to get your results is to register for My Chart.   IF you received an x-ray today, you will receive an invoice from [facility]. Please contact [facility] at [phone] with questions or concerns regarding your invoice.   IF you received labwork today, you will receive an invoice from [place]. Please contact [facility] at [phone] with questions or concerns regarding your invoice.   Our billing staff will not be able to assist you with questions regarding bills from these companies.  You will be contacted with the lab results as soon as they are available. The fastest way to get your results is to activate your My Chart account. Instructions are located on the last page of this paperwork. If you have not heard from [place] regarding the results in 2 weeks, please contact this office.

## 2018-05-07 NOTE — Progress Notes (Signed)
Established Patient Office Visit  Subjective:  Patient ID: Lynn Fleming, female    DOB: 1955-09-10  Age: 63 y.o. MRN: [id number]  CC:  Chief Complaint  Patient presents with  . difficulty keeping eyes open, eyes are tired    intermittent every day, very difficult to sleep x 3 yrs,  difficulty falling to sleep, sometimes she is up all night. Given rx for sleep but doesn't take it much as she is afraid to take often.  Can only sleep 3-4 hours.    . Abdominal Pain    pain 7/10, constant and aching all over and radiates to her back  . Back Pain    no pain with urination.  Does have urinary frequency as pt says she drinks lots of water.    HPI Lynn Fleming presents for   Back pain  -  No dysuria -  Increased urinary frequency   Abdominal pain  Radiating to the back  Hurting all over   Fatigue She has been sleeping poorly for the past 5 years    Past Medical History:  Diagnosis Date  . Hepatitis B   . Hypertension     Past Surgical History:  Procedure Laterality Date  . ESOPHAGOGASTRODUODENOSCOPY N/A 09/30/2013   Procedure: ESOPHAGOGASTRODUODENOSCOPY (EGD);  Surgeon: Winfield Cunas., MD;  Location: [facility];  Service: Endoscopy;  Laterality: N/A;  . EUS N/A 11/09/2013   Procedure: ESOPHAGEAL ENDOSCOPIC ULTRASOUND (EUS) RADIAL;  Surgeon: Arta Silence, MD;  Location: [facility];  Service: Endoscopy;  Laterality: N/A;    Family History  Problem Relation Age of Onset  . Colon cancer Neg Hx   . Stomach cancer Neg Hx   . Pancreatic cancer Neg Hx     Social History   Socioeconomic History  . Marital status: Married    Spouse name: Not on file  . Number of children: Not on file  . Years of education: Not on file  . Highest education level: Not on file  Occupational History  . Not on file  Social Needs  . Financial resource strain: Not on file  . Food insecurity:    Worry: Not on file    Inability: Not on file  . Transportation needs:   Medical: Not on file    Non-medical: Not on file  Tobacco Use  . Smoking status: Never Smoker  . Smokeless tobacco: Never Used  Substance and Sexual Activity  . Alcohol use: No  . Drug use: No  . Sexual activity: Not Currently  Lifestyle  . Physical activity:    Days per week: Not on file    Minutes per session: Not on file  . Stress: Not on file  Relationships  . Social connections:    Talks on phone: Not on file    Gets together: Not on file    Attends religious service: Not on file    Active member of club or organization: Not on file    Attends meetings of clubs or organizations: Not on file    Relationship status: Not on file  . Intimate partner violence:    Fear of current or ex partner: Not on file    Emotionally abused: Not on file    Physically abused: Not on file    Forced sexual activity: Not on file  Other Topics Concern  . Not on file  Social History Narrative  . Not on file    Outpatient Medications Prior to Visit  Medication  Sig Dispense Refill  . atorvastatin (LIPITOR) 20 MG tablet Take 1 tablet (20 mg total) by mouth daily. 90 tablet 3  . cetirizine (ZYRTEC) 10 MG tablet Take 1 tablet (10 mg total) by mouth daily. 90 tablet 3  . lisinopril (PRINIVIL,ZESTRIL) 20 MG tablet Take 1 tablet (20 mg total) by mouth daily. 90 tablet 3  . metFORMIN (GLUCOPHAGE-XR) 750 MG 24 hr tablet Take 1 tablet (750 mg total) by mouth 2 (two) times daily. (Patient taking differently: Take 750 mg by mouth daily with breakfast. ) 180 tablet 3  . aspirin 81 MG chewable tablet Chew 1 tablet (81 mg total) by mouth daily.    . cyclobenzaprine (FLEXERIL) 5 MG tablet Take 1 tablet (5 mg total) by mouth 3 (three) times daily as needed. 90 tablet 1  . omeprazole (PRILOSEC) 20 MG capsule Take 20 mg by mouth daily.    . traMADol-acetaminophen (ULTRACET) 37.5-325 MG tablet Take 1 tablet by mouth every 8 (eight) hours as needed for severe pain. 30 tablet 0   No facility-administered  medications prior to visit.     Allergies  Allergen Reactions  . Diclofenac     Abdominal pain    ROS Review of Systems Review of Systems  Constitutional: Negative for activity change, appetite change, chills and fever.  HENT: Negative for congestion, nosebleeds, trouble swallowing and voice change.   Respiratory: Negative for cough, shortness of breath and wheezing.   Gastrointestinal: Negative for diarrhea, nausea and vomiting.  Genitourinary: Negative for difficulty urinating, dysuria, flank pain and hematuria. + polyuria Musculoskeletal: Negative for back pain, joint swelling and neck pain.  Neurological: Negative for dizziness, speech difficulty, light-headedness and numbness.  See HPI. All other review of systems negative.     Objective:    Physical Exam  BP 133/80   Pulse 81   Temp 98.4 F (36.9 C) (Oral)   Resp 17   Ht 5\' 2"  (1.575 m)   Wt 130 lb (59 kg)   SpO2 98%   BMI 23.78 kg/m  Wt Readings from Last 3 Encounters:  06/01/18 128 lb (58.1 kg)  05/07/18 130 lb (59 kg)  03/02/18 134 lb 9.6 oz (61.1 kg)   Physical Exam  Constitutional: Oriented to person, place, and time. Appears well-developed and well-nourished.  HENT:  Head: Normocephalic and atraumatic.  Eyes: Conjunctivae and EOM are normal.  Cardiovascular: Normal rate, regular rhythm, normal heart sounds and intact distal pulses.  No murmur heard. Pulmonary/Chest: Effort normal and breath sounds normal. No stridor. No respiratory distress. Has no wheezes.  Abdomen: nondistended, normoactive bs, soft, nontender Neurological: Is alert and oriented to person, place, and time.  Skin: Skin is warm. Capillary refill takes less than 2 seconds.  Psychiatric: Has a normal mood and affect. Behavior is normal. Judgment and thought content normal.    Health Maintenance Due  Topic Date Due  . OPHTHALMOLOGY EXAM  06/23/1963  . HIV Screening  06/22/1968  . PAP SMEAR-Modifier  06/23/1974  . MAMMOGRAM   04/28/2008    There are no preventive care reminders to display for this patient.  Lab Results  Component Value Date   TSH 0.990 05/07/2018   Lab Results  Component Value Date   WBC 6.5 05/07/2018   HGB 12.6 05/07/2018   HCT 36.1 05/07/2018   MCV 93.6 05/07/2018   PLT 260 08/22/2017   Lab Results  Component Value Date   NA 141 05/07/2018   K 4.3 05/07/2018   CO2 22 05/07/2018  GLUCOSE 101 (H) 05/07/2018   BUN 9 05/07/2018   CREATININE 0.59 05/07/2018   BILITOT 0.6 05/07/2018   ALKPHOS 80 05/07/2018   AST 14 05/07/2018   ALT 6 05/07/2018   PROT 7.9 05/07/2018   ALBUMIN 4.9 (H) 05/07/2018   CALCIUM 10.0 05/07/2018   ANIONGAP 14 12/12/2013   Lab Results  Component Value Date   CHOL 120 03/02/2018   Lab Results  Component Value Date   HDL 37 (L) 03/02/2018   Lab Results  Component Value Date   LDLCALC 42 03/02/2018   Lab Results  Component Value Date   TRIG 205 (H) 03/02/2018   Lab Results  Component Value Date   CHOLHDL 3.2 03/02/2018   Lab Results  Component Value Date   HGBA1C 6.7 (H) 03/02/2018      Assessment & Plan:   Problem List Items Addressed This Visit    None    Visit Diagnoses    Essential hypertension    -  Primary Patient's blood pressure is at goal of 139/89 or less. Condition is stable. Continue current medications and treatment plan. I recommend that you exercise for 30-45 minutes 5 days a week. I also recommend a balanced diet with fruits and vegetables every day, lean meats, and little fried foods. The DASH diet (you can find this online) is a good example of this.    Relevant Orders   Comprehensive metabolic panel (Completed)   Controlled type 2 diabetes mellitus without complication, without long-term current use of insulin (HCC)    -  Diabetes well controlled   Relevant Orders   Comprehensive metabolic panel (Completed)   POCT glucose (manual entry) (Completed)   VITAMIN D 25 Hydroxy (Vit-D Deficiency, Fractures)  (Completed)   Other fatigue       Relevant Orders   POCT CBC (Completed)   TSH (Completed)   VITAMIN D 25 Hydroxy (Vit-D Deficiency, Fractures) (Completed)   Generalized abdominal pain       Relevant Orders   POCT CBC (Completed)   Ambulatory referral to Gastroenterology   Polyuria       Relevant Orders   POCT urinalysis dipstick (Completed)   POCT glucose (manual entry) (Completed)   Primary insomnia    -  Discussed trazodone for sleep    Relevant Medications   traZODone (DESYREL) 50 MG tablet   Gastroesophageal reflux disease without esophagitis    -  Pt could benefit from an EGD Will refer to GI   Relevant Orders   Ambulatory referral to Gastroenterology      Meds ordered this encounter  Medications  . traZODone (DESYREL) 50 MG tablet    Sig: Take 1 tablet (50 mg total) by mouth at bedtime as needed for sleep.    Dispense:  30 tablet    Refill:  3    Follow-up: Return in about 3 months (around 08/06/2018) for insomnia follow up .    Forrest Moron, MD

## 2018-05-08 LAB — COMPREHENSIVE METABOLIC PANEL
ALK PHOS: 80 IU/L (ref 39–117)
ALT: 6 IU/L (ref 0–32)
AST: 14 IU/L (ref 0–40)
Albumin/Globulin Ratio: 1.6 (ref 1.2–2.2)
Albumin: 4.9 g/dL — ABNORMAL HIGH (ref 3.8–4.8)
BILIRUBIN TOTAL: 0.6 mg/dL (ref 0.0–1.2)
BUN/Creatinine Ratio: 15 (ref 12–28)
BUN: 9 mg/dL (ref 8–27)
CO2: 22 mmol/L (ref 20–29)
Calcium: 10 mg/dL (ref 8.7–10.3)
Chloride: 103 mmol/L (ref 96–106)
Creatinine, Ser: 0.59 mg/dL (ref 0.57–1.00)
GFR calc Af Amer: 112 mL/min/{1.73_m2} (ref >59)
GFR calc non Af Amer: 97 mL/min/{1.73_m2} (ref >59)
Globulin, Total: 3 g/dL (ref 1.5–4.5)
Glucose: 101 mg/dL — ABNORMAL HIGH (ref 65–99)
Potassium: 4.3 mmol/L (ref 3.5–5.2)
Sodium: 141 mmol/L (ref 134–144)
Total Protein: 7.9 g/dL (ref 6.0–8.5)

## 2018-05-08 LAB — VITAMIN D 25 HYDROXY (VIT D DEFICIENCY, FRACTURES): Vit D, 25-Hydroxy: 15.9 ng/mL — ABNORMAL LOW (ref 30.0–100.0)

## 2018-05-08 LAB — TSH: TSH: 0.99 u[IU]/mL (ref 0.450–4.500)

## 2018-05-10 ENCOUNTER — Encounter: Payer: Self-pay | Admitting: Physician Assistant

## 2018-05-18 ENCOUNTER — Ambulatory Visit: Payer: Self-pay | Admitting: Physician Assistant

## 2018-06-01 ENCOUNTER — Ambulatory Visit: Payer: Self-pay | Admitting: Physician Assistant

## 2018-06-01 ENCOUNTER — Telehealth: Payer: Self-pay | Admitting: Family Medicine

## 2018-06-01 ENCOUNTER — Encounter: Payer: Self-pay | Admitting: Physician Assistant

## 2018-06-01 VITALS — BP 134/80 | HR 80 | Ht 61.0 in | Wt 128.0 lb

## 2018-06-01 DIAGNOSIS — R1030 Lower abdominal pain, unspecified: Secondary | ICD-10-CM

## 2018-06-01 DIAGNOSIS — K5900 Constipation, unspecified: Secondary | ICD-10-CM

## 2018-06-01 DIAGNOSIS — K3189 Other diseases of stomach and duodenum: Secondary | ICD-10-CM

## 2018-06-01 NOTE — Progress Notes (Addendum)
Chief Complaint: Lower abdominal pain which radiates to her back and bloating after meals  HPI:    Lynn Fleming is a 63 year old [place] female, previously known to Dr. Oletta Lamas, who presents clinic today with interpreter, who was referred to me by Forrest Moron, MD for a complaint of lower abdominal pain which radiates to her back and bloating after meals.      09/30/2013 EGD with a 2 cm mass in stomach.  Biopsies with chronic gastritis.  11/09/2013 EUS with a well-defined round submucosal appearing nodule along the lesser curve of the proximal gastric body, nodules 15 x 11 mm in diameter.  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  It was recommended she have a repeat EGD plus/minus EUS in 2 years for surveillance.    CT abdomen pelvis 08/22/2013 with a soft tissue mass further explored as above multiple small renal and hepatic cyst with lumbar scoliosis and diffuse degenerative change.    Today, the patient explains that over the past 10+ years she has had the same pain.  Describes a lower abdominal cramping pain which comes and goes and radiates around to her back, typically this is worse on the left side of her abdomen.  It is some better after a bowel movement.  Describes daily stools that are sometimes small and "very dry".  Apparently had a work-up in the past including colonoscopy 5 years ago with Dr. Benson Norway.  She does not know the results of this.    Denies fever, chills, weight loss, anorexia, nausea, vomiting, heartburn or reflux.    Past Medical History:  Diagnosis Date  . Hepatitis B   . Hypertension     Past Surgical History:  Procedure Laterality Date  . ESOPHAGOGASTRODUODENOSCOPY N/A 09/30/2013   Procedure: ESOPHAGOGASTRODUODENOSCOPY (EGD);  Surgeon: Winfield Cunas., MD;  Location: [facility];  Service: Endoscopy;  Laterality: N/A;  . EUS N/A 11/09/2013   Procedure: ESOPHAGEAL ENDOSCOPIC ULTRASOUND (EUS) RADIAL;  Surgeon: Arta Silence, MD;  Location: [facility];  Service: Endoscopy;  Laterality: N/A;    Current Outpatient Medications  Medication Sig Dispense Refill  . Acetaminophen (TYLENOL 8 HOUR PO) Take by mouth as needed.    Marland Kitchen atorvastatin (LIPITOR) 20 MG tablet Take 1 tablet (20 mg total) by mouth daily. 90 tablet 3  . cetirizine (ZYRTEC) 10 MG tablet Take 1 tablet (10 mg total) by mouth daily. 90 tablet 3  . lisinopril (PRINIVIL,ZESTRIL) 20 MG tablet Take 1 tablet (20 mg total) by mouth daily. 90 tablet 3  . Melatonin 5 MG TABS Take by mouth.    . metFORMIN (GLUCOPHAGE-XR) 750 MG 24 hr tablet Take 1 tablet (750 mg total) by mouth 2 (two) times daily. (Patient taking differently: Take 750 mg by mouth daily with breakfast. ) 180 tablet 3  . traZODone (DESYREL) 50 MG tablet Take 1 tablet (50 mg total) by mouth at bedtime as needed for sleep. 30 tablet 3   No current facility-administered medications for this visit.     Allergies as of 06/01/2018 - Review Complete 06/01/2018  Allergen Reaction Noted  . Diclofenac  10/06/2017    Family History  Problem Relation Age of Onset  . Colon cancer Neg Hx   . Stomach cancer Neg Hx   . Pancreatic cancer Neg Hx     Social History   Socioeconomic History  . Marital status: Married    Spouse name: Not on file  . Number of children: Not on file  .  Years of education: Not on file  . Highest education level: Not on file  Occupational History  . Not on file  Social Needs  . Financial resource strain: Not on file  . Food insecurity:    Worry: Not on file    Inability: Not on file  . Transportation needs:    Medical: Not on file    Non-medical: Not on file  Tobacco Use  . Smoking status: Never Smoker  . Smokeless tobacco: Never Used  Substance and Sexual Activity  . Alcohol use: No  . Drug use: No  . Sexual activity: Not Currently  Lifestyle  . Physical activity:    Days per week: Not on file    Minutes per session: Not on file  . Stress: Not on file  Relationships  .  Social connections:    Talks on phone: Not on file    Gets together: Not on file    Attends religious service: Not on file    Active member of club or organization: Not on file    Attends meetings of clubs or organizations: Not on file    Relationship status: Not on file  . Intimate partner violence:    Fear of current or ex partner: Not on file    Emotionally abused: Not on file    Physically abused: Not on file    Forced sexual activity: Not on file  Other Topics Concern  . Not on file  Social History Narrative  . Not on file    Review of Systems:    Constitutional: No weight loss, fever or chills Skin: No rash  Cardiovascular: No chest pain Respiratory: No SOB  Gastrointestinal: See HPI and otherwise negative Genitourinary: No dysuria  Neurological: No headache, dizziness or syncope Musculoskeletal: No new muscle or joint pain Hematologic: No bleeding  Psychiatric: No history of depression or anxiety   Physical Exam:  Vital signs: BP 134/80   Pulse 80   Ht 5\' 1"  (1.549 m)   Wt 128 lb (58.1 kg)   BMI 24.19 kg/m   Constitutional:   Pleasant [place] female appears to be in NAD, Well developed, Well nourished, alert and cooperative Head:  Normocephalic and atraumatic. Eyes:   PEERL, EOMI. No icterus. Conjunctiva pink. Ears:  Normal auditory acuity. Neck:  Supple Throat: Oral cavity and pharynx without inflammation, swelling or lesion.  Respiratory: Respirations even and unlabored. Lungs clear to auscultation bilaterally.   No wheezes, crackles, or rhonchi.  Cardiovascular: Normal S1, S2. No MRG. Regular rate and rhythm. No peripheral edema, cyanosis or pallor.  Gastrointestinal:  Soft, nondistended, Mild LLQ ttp. No rebound or guarding. Normal bowel sounds. No appreciable masses or hepatomegaly. Rectal:  Not performed.  Msk:  Symmetrical without gross deformities. Without edema, no deformity or joint abnormality.+scoliosis  Neurologic:  Alert and  oriented x4;   grossly normal neurologically.  Skin:   Dry and intact without significant lesions or rashes. Psychiatric:  Demonstrates good judgement and reason without abnormal affect or behaviors.  MOST RECENT LABS AND IMAGING: CBC    Component Value Date/Time   WBC 6.5 05/07/2018 1130   WBC 5.3 03/15/2016 1225   RBC 3.86 (A) 05/07/2018 1130   RBC 4.27 03/15/2016 1225   HGB 12.6 05/07/2018 1130   HGB 12.4 08/22/2017 1210   HCT 36.1 05/07/2018 1130   HCT 38.2 08/22/2017 1210   PLT 260 08/22/2017 1210   MCV 93.6 05/07/2018 1130   MCV 95 08/22/2017 1210   MCH 32.6 (  A) 05/07/2018 1130   MCH 31.6 03/15/2016 1225   MCHC 34.9 05/07/2018 1130   MCHC 33.1 03/15/2016 1225   RDW 13.3 08/22/2017 1210   LYMPHSABS 2.4 08/22/2017 1210   MONOABS 424 03/15/2016 1225   EOSABS 0.3 08/22/2017 1210   BASOSABS 0.0 08/22/2017 1210    CMP     Component Value Date/Time   NA 141 05/07/2018 1135   K 4.3 05/07/2018 1135   CL 103 05/07/2018 1135   CO2 22 05/07/2018 1135   GLUCOSE 101 (H) 05/07/2018 1135   GLUCOSE 203 (H) 03/15/2016 1225   BUN 9 05/07/2018 1135   CREATININE 0.59 05/07/2018 1135   CREATININE 0.59 03/15/2016 1225   CALCIUM 10.0 05/07/2018 1135   PROT 7.9 05/07/2018 1135   ALBUMIN 4.9 (H) 05/07/2018 1135   AST 14 05/07/2018 1135   ALT 6 05/07/2018 1135   ALKPHOS 80 05/07/2018 1135   BILITOT 0.6 05/07/2018 1135   GFRNONAA 97 05/07/2018 1135   GFRNONAA >89 03/15/2016 1225   GFRAA 112 05/07/2018 1135   GFRAA >89 03/15/2016 1225    Assessment: 1.  Lower abdominal pain: Bilateral lower abdominal cramping pain, worse in the left which radiates around to her back; Consider relation to constipation versus scoliosis 2.  Constipation: Small dry stools on a daily basis per patient, question whether or not this is contributing to pain above 3.  Gastric mass: Seen in 2015 evaluated with EGD and EUS, thought lipoma but repeat was recommended in 2 years, I cannot find pathology on this  Plan: 1.   Discussed with patient that per recommendations from Dr. Paulita Fujita she was to have a repeat EUS in 2017, she is overdue for this.  Patient tells me she is without insurance at the moment, but would like to have whatever is needed.  She will discuss with her family to help her pay for this in the future.  We will further discuss at follow-up visit and possibly schedule. 2.  Would recommend the patient start MiraLAX once daily.  To help with more regular formed stools. 3.  Discussed a fiber supllement such as Metamucil once daily. 4.  Discussed a daily probiotic such as Electronics engineer. 5.  Provided the patient with information regarding [facility] patient assistance 6.  We will request colonoscopy from Dr. Benson Norway 5 years ago 7.  Patient to follow in clinic in 4 to 6 weeks with me or sooner if necessary.  At that time will need to discuss repeat EUS +/- colonoscopy pending results. Assigned to Dr. Silverio Decamp today.  Ellouise Newer, PA-C [facility] 06/01/2018, 9:51 AM  Cc: Forrest Moron, MD   Addendum 06/09/2018 [phone]  Received notes from Dr. Ulyses Amor office:  04/27/2013 patient was evaluated for abdominal pain, at that time plans were for EGD and colonoscopy.  Is also discussed she may have functional bowel issues.  Also noted she had chronic hepatitis B.  07/19/2013 colonoscopy for generalized abdominal pain with two 3-4 mm polyps in the descending colon and otherwise normal ; pathology hyperplastic polyps, mucosal prolapse-type polyp  07/19/2013 EGD with normal esophagus, localized mild inflammation characterized by erythema in the gastric fundus and a few localized erosions without bleeding in the duodenal bulb; Pathology: Eosinophilic gastritis pattern of injury, no intestinal metaplasia, no H. pylori.  09/30/2013 EGD with biopsy due to CT scan showing 2 cm soft tissue mass in the proximal stomach.  Impression: 2 cm mass in the proximal stomach, appears submucosal, not clear if this was incidental related  to her chronic left upper quadrant pain; pathology with chronic gastritis no dysplasia or malignancy  Next colonoscopy will be due in 2025.  Patient is still due for repeat EUS given findings in 2015.  Will discuss at return office visit.  Lynn Barman, PA-C

## 2018-06-01 NOTE — Patient Instructions (Addendum)
If you are age 65 or older, your body mass index should be between 23-30. Your Body mass index is 24.19 kg/m. If this is out of the aforementioned range listed, please consider follow up with your Primary Care Provider.  If you are age 69 or younger, your body mass index should be between 19-25. Your Body mass index is 24.19 kg/m. If this is out of the aformentioned range listed, please consider follow up with your Primary Care Provider.   Start Miralax daily. (over-the-counter)  Start Fiber supplement daily. (over-the-counter)  You have been given Patent examiner.  Follow up with me on July 05, 2018 at 9:45 am.  Thank you for choosing me and [facility].    Ellouise Newer, PA-C

## 2018-06-01 NOTE — Telephone Encounter (Signed)
LVM for pt with the interpreter services in regards to the appt they have scheduled with Dr. Nolon Rod on 5/18. Dr. Nolon Rod will be out of the office that day and pt will need to be rescheduled. When pt calls back, please reschedule that appt at their convenience. Thank you!   OR - we can reschedule the appt if she comes in for the appt on 4/21. After she sees Dr. Nolon Rod they may want to have a different date for a F/U

## 2018-06-07 NOTE — Progress Notes (Signed)
Reviewed and agree with documentation and assessment and plan. K. Veena Tashianna Broome , MD   

## 2018-07-05 ENCOUNTER — Ambulatory Visit: Payer: Self-pay | Admitting: Physician Assistant

## 2018-07-05 ENCOUNTER — Other Ambulatory Visit: Payer: Self-pay | Admitting: Urgent Care

## 2018-07-05 DIAGNOSIS — E119 Type 2 diabetes mellitus without complications: Secondary | ICD-10-CM

## 2018-07-05 DIAGNOSIS — I10 Essential (primary) hypertension: Secondary | ICD-10-CM

## 2018-07-05 DIAGNOSIS — E782 Mixed hyperlipidemia: Secondary | ICD-10-CM

## 2018-07-28 ENCOUNTER — Encounter (HOSPITAL_COMMUNITY): Payer: Self-pay | Admitting: Emergency Medicine

## 2018-07-28 ENCOUNTER — Ambulatory Visit (HOSPITAL_COMMUNITY)
Admission: EM | Admit: 2018-07-28 | Discharge: 2018-07-28 | Disposition: A | Discharge status: Home / Self Care | Payer: Self-pay | Attending: Family Medicine | Admitting: Family Medicine

## 2018-07-28 ENCOUNTER — Emergency Department (HOSPITAL_COMMUNITY)
Admission: EM | Admit: 2018-07-28 | Discharge: 2018-07-29 | Disposition: A | Discharge status: Home / Self Care | Payer: Medicaid Other | Attending: Emergency Medicine | Admitting: Emergency Medicine

## 2018-07-28 ENCOUNTER — Encounter (HOSPITAL_COMMUNITY): Payer: Self-pay

## 2018-07-28 ENCOUNTER — Other Ambulatory Visit: Payer: Self-pay

## 2018-07-28 DIAGNOSIS — I10 Essential (primary) hypertension: Secondary | ICD-10-CM | POA: Diagnosis not present

## 2018-07-28 DIAGNOSIS — R1032 Left lower quadrant pain: Secondary | ICD-10-CM | POA: Diagnosis not present

## 2018-07-28 DIAGNOSIS — Z7984 Long term (current) use of oral hypoglycemic drugs: Secondary | ICD-10-CM | POA: Diagnosis not present

## 2018-07-28 DIAGNOSIS — Z79899 Other long term (current) drug therapy: Secondary | ICD-10-CM | POA: Insufficient documentation

## 2018-07-28 DIAGNOSIS — R5383 Other fatigue: Secondary | ICD-10-CM

## 2018-07-28 DIAGNOSIS — R634 Abnormal weight loss: Secondary | ICD-10-CM

## 2018-07-28 DIAGNOSIS — Z8673 Personal history of transient ischemic attack (TIA), and cerebral infarction without residual deficits: Secondary | ICD-10-CM | POA: Diagnosis not present

## 2018-07-28 DIAGNOSIS — R1084 Generalized abdominal pain: Secondary | ICD-10-CM

## 2018-07-28 LAB — URINALYSIS, ROUTINE W REFLEX MICROSCOPIC
Bacteria, UA: NONE SEEN
Bilirubin Urine: NEGATIVE
Glucose, UA: NEGATIVE mg/dL
Ketones, ur: 20 mg/dL — AB
Leukocytes,Ua: NEGATIVE
Nitrite: NEGATIVE
Protein, ur: NEGATIVE mg/dL
Specific Gravity, Urine: 1.008 (ref 1.005–1.030)
pH: 7 (ref 5.0–8.0)

## 2018-07-28 LAB — COMPREHENSIVE METABOLIC PANEL
ALT: 8 U/L (ref 0–44)
AST: 13 U/L — ABNORMAL LOW (ref 15–41)
Albumin: 4.6 g/dL (ref 3.5–5.0)
Alkaline Phosphatase: 61 U/L (ref 38–126)
Anion gap: 12 (ref 5–15)
BUN: 7 mg/dL — ABNORMAL LOW (ref 8–23)
CO2: 23 mmol/L (ref 22–32)
Calcium: 9.8 mg/dL (ref 8.9–10.3)
Chloride: 105 mmol/L (ref 98–111)
Creatinine, Ser: 0.51 mg/dL (ref 0.44–1.00)
GFR calc Af Amer: >60 mL/min (ref >60)
GFR calc non Af Amer: >60 mL/min (ref >60)
Glucose, Bld: 100 mg/dL — ABNORMAL HIGH (ref 70–99)
Potassium: 3.2 mmol/L — ABNORMAL LOW (ref 3.5–5.1)
Sodium: 140 mmol/L (ref 135–145)
Total Bilirubin: 1.1 mg/dL (ref 0.3–1.2)
Total Protein: 7.7 g/dL (ref 6.5–8.1)

## 2018-07-28 LAB — CBC
HCT: 39.4 % (ref 36.0–46.0)
Hemoglobin: 13.1 g/dL (ref 12.0–15.0)
MCH: 31.6 pg (ref 26.0–34.0)
MCHC: 33.2 g/dL (ref 30.0–36.0)
MCV: 95.2 fL (ref 80.0–100.0)
Platelets: 239 10*3/uL (ref 150–400)
RBC: 4.14 MIL/uL (ref 3.87–5.11)
RDW: 12.4 % (ref 11.5–15.5)
WBC: 7.8 10*3/uL (ref 4.0–10.5)
nRBC: 0 % (ref 0.0–0.2)

## 2018-07-28 LAB — LIPASE, BLOOD: Lipase: 41 U/L (ref 11–51)

## 2018-07-28 MED ORDER — SODIUM CHLORIDE 0.9% FLUSH: 3.0000 mL | Freq: Once | INTRAVENOUS | Status: DC

## 2018-07-28 NOTE — ED Triage Notes (Signed)
Abdominal pain and back hurts.  Onset 4 weeks ago.  Pain is getting worse.  Today pain is worse and more constant.    No known fever.    Has not seen a provider for this.

## 2018-07-28 NOTE — ED Provider Notes (Addendum)
[place]   [id number] 07/28/18 Arrival Time: 1847  CC: ABDOMINAL DISCOMFORT  SUBJECTIVE: HPI obtained from [place] interpreter: [place] [phone]  Lynn Fleming is a 63 y.o. female hx significant for hep b, and HTN, who presents with complaint of abdominal discomfort that began 4 weeks ago.  Denies a precipitating event, trauma, close contacts with similar symptoms, recent travel or antibiotic use.  Pain is diffuse about the abdomen.  Describes as constant, worsening and dull and achy in character.  8/10.  Has tried prescribed OTC pain medication without relief.  Does not know name of medication.  Denies alleviating or aggravating factors.  Denies association with eating or using the restroom. Reports similar symptoms in the past that did not improve with pain medication.  Last BM this morning and normal for patient.  Complains of chills, subjective fever, decreased appetite, weight loss 12-13 lbs over the past month, night sweats, fatigue and body aches in back.    Denies fever, nausea, vomiting, chest pain, SOB, diarrhea, constipation, hematochezia, melena, dysuria, difficulty urinating, increased frequency or urgency, flank pain, loss of bowel or bladder function.    Upon review of chart, pt seen by GI in February of this year. Was suppose to follow up in March, but appointment was cancelled per patient.  Was seen for lower abdominal cramping, constipation, and gastric mass.  Was instructed to follow up in March to discuss possible EUS +/- colonoscopy.  GI also encouraged patient to take miralx, metamucil, and probiotic.    No LMP recorded. Patient is postmenopausal.  ROS: As per HPI.  Past Medical History:  Diagnosis Date  . Hepatitis B   . Hypertension    Past Surgical History:  Procedure Laterality Date  . ESOPHAGOGASTRODUODENOSCOPY N/A 09/30/2013   Procedure: ESOPHAGOGASTRODUODENOSCOPY (EGD);  Surgeon: Winfield Cunas., MD;  Location: [facility];  Service:  Endoscopy;  Laterality: N/A;  . EUS N/A 11/09/2013   Procedure: ESOPHAGEAL ENDOSCOPIC ULTRASOUND (EUS) RADIAL;  Surgeon: Arta Silence, MD;  Location: [facility];  Service: Endoscopy;  Laterality: N/A;   Allergies  Allergen Reactions  . Diclofenac     Abdominal pain   No current facility-administered medications on file prior to encounter.    Current Outpatient Medications on File Prior to Encounter  Medication Sig Dispense Refill  . atorvastatin (LIPITOR) 20 MG tablet Take 1 tablet by mouth once daily 30 tablet 0  . lisinopril (PRINIVIL,ZESTRIL) 20 MG tablet Take 1 tablet by mouth once daily 30 tablet 0  . Melatonin 5 MG TABS Take by mouth.    . metFORMIN (GLUCOPHAGE-XR) 750 MG 24 hr tablet Take 1 tablet by mouth twice daily 60 tablet 0  . Acetaminophen (TYLENOL 8 HOUR PO) Take by mouth as needed.    Noelle Penner ALLERGY RELIEF, CETIRIZINE, 10 MG tablet Take 1 tablet by mouth once daily 90 tablet 0  . traZODone (DESYREL) 50 MG tablet Take 1 tablet (50 mg total) by mouth at bedtime as needed for sleep. 30 tablet 3   Social History   Socioeconomic History  . Marital status: Married    Spouse name: Not on file  . Number of children: Not on file  . Years of education: Not on file  . Highest education level: Not on file  Occupational History  . Not on file  Social Needs  . Financial resource strain: Not on file  . Food insecurity:    Worry: Not on file    Inability: Not on file  .  Transportation needs:    Medical: Not on file    Non-medical: Not on file  Tobacco Use  . Smoking status: Never Smoker  . Smokeless tobacco: Never Used  Substance and Sexual Activity  . Alcohol use: No  . Drug use: No  . Sexual activity: Not Currently  Lifestyle  . Physical activity:    Days per week: Not on file    Minutes per session: Not on file  . Stress: Not on file  Relationships  . Social connections:    Talks on phone: Not on file    Gets together: Not on file    Attends religious  service: Not on file    Active member of club or organization: Not on file    Attends meetings of clubs or organizations: Not on file    Relationship status: Not on file  . Intimate partner violence:    Fear of current or ex partner: Not on file    Emotionally abused: Not on file    Physically abused: Not on file    Forced sexual activity: Not on file  Other Topics Concern  . Not on file  Social History Narrative  . Not on file   Family History  Problem Relation Age of Onset  . Colon cancer Neg Hx   . Stomach cancer Neg Hx   . Pancreatic cancer Neg Hx      OBJECTIVE:  Vitals:   07/28/18 1948  BP: (!) 155/93  Pulse: 88  Resp: 18  Temp: 98.3 F (36.8 C)  TempSrc: Oral  SpO2: 99%    General appearance: Alert; NAD HEENT: NCAT.  Oropharynx clear.  Lungs: clear to auscultation bilaterally without adventitious breath sounds Heart: regular rate and rhythm.  Radial pulses 2+ symmetrical bilaterally Abdomen: soft, non-distended; normal active bowel sounds; TTP over LLQ, dullness to percussion over LLQ; nontender at McBurney's point; negative Murphy's sign; negative rebound; no guarding Back: no CVA tenderness Extremities: no edema; symmetrical with no gross deformities Skin: warm and dry Neurologic: normal gait Psychological: alert and cooperative; normal mood and affect  ASSESSMENT & PLAN:  1. Diffuse abdominal pain   2. Loss of weight   3. Other fatigue     No orders of the defined types were placed in this encounter.  Recommending further evaluation and management in the ED for worsening abdominal pain.  Patient aware and in agreement with plan.      Lestine Box, PA-C 07/28/18 2046

## 2018-07-28 NOTE — ED Triage Notes (Signed)
Interpreter used for triage. Pt reports fatigue, abd pain and back pain for a long time but has gotten worse the past few months. Burning pain 8/10. No n.v.d, no urinary s.s

## 2018-07-28 NOTE — Discharge Instructions (Addendum)
Recommending further evaluation and management in the ED for worsening abdominal pain.  Patient aware and in agreement with plan.

## 2018-07-29 ENCOUNTER — Emergency Department (HOSPITAL_COMMUNITY): Payer: Medicaid Other

## 2018-07-29 MED ORDER — IOHEXOL 300 MG/ML  SOLN
100.0000 mL | Freq: Once | INTRAMUSCULAR | Status: AC | PRN
  Administered 2018-07-29: 02:00:00 100 mL via INTRAVENOUS

## 2018-07-29 MED ORDER — POLYETHYLENE GLYCOL 3350 17 G PO PACK: 17.0000 g | PACK | Freq: Every day | ORAL | 0 refills | Status: DC

## 2018-07-29 MED ORDER — POTASSIUM CHLORIDE CRYS ER 20 MEQ PO TBCR
40.0000 meq | EXTENDED_RELEASE_TABLET | Freq: Once | ORAL | Status: AC
  Administered 2018-07-29: 04:00:00 40 meq via ORAL
  Filled 2018-07-29: qty 2

## 2018-07-29 NOTE — ED Provider Notes (Signed)
[facility] EMERGENCY DEPARTMENT Provider Note   CSN: [id number] Arrival date & time: 07/28/18  2032    History   Chief Complaint Chief Complaint  Patient presents with  . Abdominal Pain    HPI Lynn Fleming is a 63 y.o. female.     Level 5 caveat for language barrier.  Translator used.  Patient presents with lower abdominal pain worse in the left side.  This is been constant for about the past 1 month.  She has had this pain on and off for several years.  She reports the pain is dull and aching and progressively worsening.  She states she is lost 12 pounds in the past month.  She was seen in urgent care and referred to the ED.  She has had similar pain in the past and seen gastroenterology in February.  She was opposed to follow-up for colonoscopy and endoscopic ultrasound but did not due to the pandemic.  Patient complains of subjective chills but no documented fever.  She denies any nausea, vomiting, diarrhea, pain with urination, blood in the urine.  No vaginal bleeding or vaginal discharge.  No chest pain or shortness of breath.  No previous abdominal surgeries. Patient states she is been trying to manage at home but cannot because the pain is progressively worsening.  Denies any black or bloody stools.  She is also had weight loss over the past month with fatigue and chills but no documented fever. Patient saw gastroenterology in February for lower abdominal cramping, constipation and gastric mass.  She was supposed to follow-up for EUS plus colonoscopy but did not.  She is prescribed MiraLAX, Metamucil and probiotic.  GI records reviewed: 07/19/2013 colonoscopy for generalized abdominal pain with two 3-4 mm polyps in the descending colon and otherwise normal ; pathology hyperplastic polyps, mucosal prolapse-type polyp  07/19/2013 EGD with normal esophagus, localized mild inflammation characterized by erythema in the gastric fundus and a few localized erosions  without bleeding in the duodenal bulb; Pathology: Eosinophilic gastritis pattern of injury, no intestinal metaplasia, no H. pylori.  09/30/2013 EGD with biopsy due to CT scan showing 2 cm soft tissue mass in the proximal stomach.  Impression: 2 cm mass in the proximal stomach, appears submucosal, not clear if this was incidental related to her chronic left upper quadrant pain; pathology with chronic gastritis no dysplasia or malignancy  The history is provided by the patient. The history is limited by a language barrier. A language interpreter was used.  Abdominal Pain  Associated symptoms: no chest pain, no diarrhea, no dysuria, no hematuria, no nausea, no shortness of breath and no vomiting     Past Medical History:  Diagnosis Date  . Hepatitis B   . Hypertension     Patient Active Problem List   Diagnosis Date Noted  . Depression 03/15/2016  . Benign essential HTN 03/15/2016  . Scoliosis of lumbar spine 03/15/2016  . Degenerative disc disease, lumbar 03/15/2016  . Headache 12/12/2013  . TIA (transient ischemic attack) 12/12/2013    Past Surgical History:  Procedure Laterality Date  . ESOPHAGOGASTRODUODENOSCOPY N/A 09/30/2013   Procedure: ESOPHAGOGASTRODUODENOSCOPY (EGD);  Surgeon: Winfield Cunas., MD;  Location: [facility];  Service: Endoscopy;  Laterality: N/A;  . EUS N/A 11/09/2013   Procedure: ESOPHAGEAL ENDOSCOPIC ULTRASOUND (EUS) RADIAL;  Surgeon: Arta Silence, MD;  Location: [facility];  Service: Endoscopy;  Laterality: N/A;     OB History   No obstetric history on file.  Home Medications    Prior to Admission medications   Medication Sig Start Date End Date Taking? Authorizing Provider  Acetaminophen (TYLENOL 8 HOUR PO) Take by mouth as needed.    [provider]  atorvastatin (LIPITOR) 20 MG tablet Take 1 tablet by mouth once daily 07/05/18   Forrest Moron, MD  EQ ALLERGY RELIEF, CETIRIZINE, 10 MG tablet Take 1 tablet by mouth once daily  07/05/18   Delia Chimes A, MD  lisinopril (PRINIVIL,ZESTRIL) 20 MG tablet Take 1 tablet by mouth once daily 07/05/18   Forrest Moron, MD  Melatonin 5 MG TABS Take by mouth.    [provider]  metFORMIN (GLUCOPHAGE-XR) 750 MG 24 hr tablet Take 1 tablet by mouth twice daily 07/05/18   Forrest Moron, MD  traZODone (DESYREL) 50 MG tablet Take 1 tablet (50 mg total) by mouth at bedtime as needed for sleep. 05/07/18   Forrest Moron, MD    Family History Family History  Problem Relation Age of Onset  . Colon cancer Neg Hx   . Stomach cancer Neg Hx   . Pancreatic cancer Neg Hx     Social History Social History   Tobacco Use  . Smoking status: Never Smoker  . Smokeless tobacco: Never Used  Substance Use Topics  . Alcohol use: No  . Drug use: No     Allergies   Diclofenac   Review of Systems Review of Systems  Constitutional: Positive for activity change and appetite change.  HENT: Negative for congestion and rhinorrhea.   Respiratory: Negative for chest tightness and shortness of breath.   Cardiovascular: Negative for chest pain.  Gastrointestinal: Positive for abdominal pain. Negative for diarrhea, nausea and vomiting.  Genitourinary: Negative for dysuria and hematuria.  Musculoskeletal: Negative for arthralgias and myalgias.  Skin: Negative for rash.  Neurological: Negative for dizziness, weakness and headaches.   all other systems are negative except as noted in the HPI and PMH.     Physical Exam Updated Vital Signs BP (!) 153/94 (BP Location: Right Arm)   Pulse 76   Temp 98.5 F (36.9 C) (Oral)   Resp 17   SpO2 100%   Physical Exam Vitals signs and nursing note reviewed.  Constitutional:      General: She is not in acute distress.    Appearance: She is well-developed.  HENT:     Head: Normocephalic and atraumatic.     Mouth/Throat:     Pharynx: No oropharyngeal exudate.  Eyes:     Conjunctiva/sclera: Conjunctivae normal.     Pupils: Pupils  are equal, round, and reactive to light.  Neck:     Musculoskeletal: Normal range of motion and neck supple.     Comments: No meningismus. Cardiovascular:     Rate and Rhythm: Normal rate and regular rhythm.     Heart sounds: Normal heart sounds. No murmur.  Pulmonary:     Effort: Pulmonary effort is normal. No respiratory distress.     Breath sounds: Normal breath sounds.  Abdominal:     Palpations: Abdomen is soft.     Tenderness: There is abdominal tenderness. There is no guarding or rebound.     Comments: Mild left-sided tenderness without guarding or rebound  Musculoskeletal: Normal range of motion.        General: No tenderness.     Comments: No CVA tenderness  Skin:    General: Skin is warm.     Capillary Refill: Capillary refill takes less than  2 seconds.  Neurological:     General: No focal deficit present.     Mental Status: She is alert and oriented to person, place, and time. Mental status is at baseline.     Cranial Nerves: No cranial nerve deficit.     Motor: No abnormal muscle tone.     Coordination: Coordination normal.     Comments:  5/5 strength throughout. CN 2-12 intact.Equal grip strength.   Psychiatric:        Behavior: Behavior normal.      ED Treatments / Results  Labs (all labs ordered are listed, but only abnormal results are displayed) Labs Reviewed  COMPREHENSIVE METABOLIC PANEL - Abnormal; Notable for the following components:      Result Value   Potassium 3.2 (*)    Glucose, Bld 100 (*)    BUN 7 (*)    AST 13 (*)    All other components within normal limits  URINALYSIS, ROUTINE W REFLEX MICROSCOPIC - Abnormal; Notable for the following components:   Color, Urine STRAW (*)    Hgb urine dipstick SMALL (*)    Ketones, ur 20 (*)    All other components within normal limits  LIPASE, BLOOD  CBC    EKG None  Radiology Ct Abdomen Pelvis W Contrast  Result Date: 07/29/2018 CLINICAL DATA:  Abdominal pain for several weeks EXAM: CT ABDOMEN  AND PELVIS WITH CONTRAST TECHNIQUE: Multidetector CT imaging of the abdomen and pelvis was performed using the standard protocol following bolus administration of intravenous contrast. CONTRAST:  148mL OMNIPAQUE 300 COMPARISON:  None. FINDINGS: Lower chest: Lung bases are free of acute infiltrate or sizable effusion. Hepatobiliary: Mild fatty infiltration of the liver is noted. Scattered cysts are noted within the liver. The gallbladder is well distended and within normal limits. Pancreas: Unremarkable. No pancreatic ductal dilatation or surrounding inflammatory changes. Spleen: Normal in size without focal abnormality. Adrenals/Urinary Tract: Adrenal glands are within normal limits. Kidneys demonstrate no evidence of renal calculi. Bilateral renal cystic change is noted. No obstructive changes are seen. The bladder is partially distended. Stomach/Bowel: The appendix is within normal limits. No obstructive or inflammatory changes of the larger small-bowel are seen. The stomach is within normal limits. Vascular/Lymphatic: Aortic atherosclerosis. No enlarged abdominal or pelvic lymph nodes. Reproductive: Uterus and bilateral adnexa are unremarkable. Other: No abdominal wall hernia or abnormality. No abdominopelvic ascites. Musculoskeletal: Degenerative changes of the lumbar spine are noted. Scoliosis concave to the right is noted. Chronic appearing L2 compression deformity is noted. IMPRESSION: Chronic changes without acute abnormality. Electronically Signed   By: Inez Catalina M.D.   On: 07/29/2018 02:49    Procedures Procedures (including critical care time)  Medications Ordered in ED Medications  sodium chloride flush (NS) 0.9 % injection 3 mL (has no administration in time range)     Initial Impression / Assessment and Plan / ED Course  I have reviewed the triage vital signs and the nursing notes.  Pertinent labs & imaging results that were available during my care of the patient were reviewed by me  and considered in my medical decision making (see chart for details).       Recurrent abdominal pain.  Abdomen is soft without peritoneal signs.  Patient given IV fluids and symptom control.  Abdomen is soft without peritoneal signs.  Labs are reassuring.  Urinalysis is negative.  Gastroenterology records reviewed which shows that patient is overdue for follow-up regarding the findings of her gastric mass which was apparently not  cancerous and colon polyps.  CT today is reassuring without acute findings.  Discussed with patient that there is no acute pathology and her work-up appears to be unremarkable.  Advised to continue MiraLAX and probiotics and follow-up with her gastroenterologist for the follow-up indicated on her previous visit. Return precautions discussed.   Final Clinical Impressions(s) / ED Diagnoses   Final diagnoses:  Left lower quadrant abdominal pain    ED Discharge Orders    None       Ysabela Keisler, Annie Main, MD 07/29/18 [phone]

## 2018-07-29 NOTE — Discharge Instructions (Signed)
Take the stomach medication as prescribed.  Follow-up with your stomach doctor for the further testing that were recommended when you saw them in February.  Return to the ED if your pain becomes worse, associated with persistent vomiting, fever or any other concerns.

## 2018-07-29 NOTE — ED Notes (Signed)
Iv placed for c-t

## 2018-07-29 NOTE — ED Notes (Signed)
Pt alert reports that she speaks no english.  She does not appear in any acute diesttess

## 2018-08-03 ENCOUNTER — Other Ambulatory Visit: Payer: Self-pay

## 2018-08-03 ENCOUNTER — Telehealth (INDEPENDENT_AMBULATORY_CARE_PROVIDER_SITE_OTHER): Payer: Self-pay | Admitting: Family Medicine

## 2018-08-03 ENCOUNTER — Telehealth: Payer: Self-pay | Admitting: Family Medicine

## 2018-08-03 ENCOUNTER — Telehealth: Payer: Self-pay

## 2018-08-03 VITALS — Ht 61.0 in | Wt 114.0 lb

## 2018-08-03 DIAGNOSIS — I10 Essential (primary) hypertension: Secondary | ICD-10-CM

## 2018-08-03 DIAGNOSIS — Z7189 Other specified counseling: Secondary | ICD-10-CM

## 2018-08-03 DIAGNOSIS — F322 Major depressive disorder, single episode, severe without psychotic features: Secondary | ICD-10-CM

## 2018-08-03 DIAGNOSIS — E782 Mixed hyperlipidemia: Secondary | ICD-10-CM

## 2018-08-03 DIAGNOSIS — R45851 Suicidal ideations: Secondary | ICD-10-CM

## 2018-08-03 DIAGNOSIS — R634 Abnormal weight loss: Secondary | ICD-10-CM

## 2018-08-03 DIAGNOSIS — E119 Type 2 diabetes mellitus without complications: Secondary | ICD-10-CM

## 2018-08-03 DIAGNOSIS — F459 Somatoform disorder, unspecified: Secondary | ICD-10-CM

## 2018-08-03 MED ORDER — METFORMIN HCL ER 750 MG PO TB24: 750.0000 mg | ORAL_TABLET | Freq: Two times a day (BID) | ORAL | 0 refills | Status: DC

## 2018-08-03 MED ORDER — CLONAZEPAM 0.5 MG PO TABS: 0.5000 mg | ORAL_TABLET | Freq: Two times a day (BID) | ORAL | 1 refills | Status: DC | PRN

## 2018-08-03 MED ORDER — ATORVASTATIN CALCIUM 20 MG PO TABS: 20.0000 mg | ORAL_TABLET | Freq: Every day | ORAL | 0 refills | Status: DC

## 2018-08-03 MED ORDER — FLUOXETINE HCL 20 MG PO TABS: ORAL_TABLET | ORAL | 3 refills | Status: DC

## 2018-08-03 MED ORDER — LISINOPRIL 20 MG PO TABS: 20.0000 mg | ORAL_TABLET | Freq: Every day | ORAL | 0 refills | Status: DC

## 2018-08-03 MED ORDER — ACETAMINOPHEN ER 650 MG PO TBCR: 650.0000 mg | EXTENDED_RELEASE_TABLET | Freq: Three times a day (TID) | ORAL | 1 refills | Status: AC | PRN

## 2018-08-03 NOTE — Telephone Encounter (Signed)
Copied from [place] [phone]. Topic: General - Other >> Aug 03, 2018 10:34 AM Celene Kras A wrote: Reason for CRM: [facility] Pharmacist called stating she needed clarification on wether to fill prescriptions sent today with tablets or capsules. She states capsules would be cheaper for the pt. Please advise.

## 2018-08-03 NOTE — Telephone Encounter (Signed)
Per stallings advised daughte Webb Silversmith to discontinue her mother's metformin until she starts to eat again.  Metformin has been sent to pharmacy but doesn't need to p/u until pt is eating regularly.  Anne agreeable. Dgaddy, CMA

## 2018-08-03 NOTE — Progress Notes (Signed)
CC: 3 month f/u insomnia.  Pt seen in ED on last Wednesday but discharged with no dx-couldn't find anything wrong with pt.  Pt c/o fatigue and stomach discomfort.  Pt very tired and not wanting to do anything per daughter Webb Silversmith.  No covid-19 symptoms present.  Pt requesting bp med change as lisinopril is not controlling bp's- per pt bp's running 159/92 in the morining and 160/92 in the afternoon.  No travel outside the [place] or [place] in the last 3 weeks. Depression score:24

## 2018-08-03 NOTE — Patient Instructions (Signed)
° ° ° °  If you have lab work done today you will be contacted with your lab results within the next 2 weeks.  If you have not heard from us then please contact us. The fastest way to get your results is to register for My Chart. ° ° °IF you received an x-ray today, you will receive an invoice from [facility]. Please contact [facility] at [phone] with questions or concerns regarding your invoice.  ° °IF you received labwork today, you will receive an invoice from [facility]. Please contact [facility] at [phone] with questions or concerns regarding your invoice.  ° °Our billing staff will not be able to assist you with questions regarding bills from these companies. ° °You will be contacted with the lab results as soon as they are available. The fastest way to get your results is to activate your My Chart account. Instructions are located on the last page of this paperwork. If you have not heard from us regarding the results in 2 weeks, please contact this office. °  ° ° ° °

## 2018-08-03 NOTE — Progress Notes (Signed)
Lynn Fleming Encounter- SOAP NOTE Established Patient  This telephone encounter was conducted with the patient's (or proxy's) verbal consent via audio telecommunications: yes/no: Yes Patient was instructed to have this encounter in a suitably private space; and to only have persons present to whom they give permission to participate. In addition, patient identity was confirmed by use of name plus two identifiers (DOB and address).  I discussed the limitations, risks, security and privacy concerns of performing an evaluation and management service by telephone and the availability of in person appointments. I also discussed with the patient that there may be a patient responsible charge related to this service. The patient expressed understanding and agreed to proceed.  I spent a total of TIME; 0 MIN TO 60 MIN: 30 minutes talking with the patient or their proxy.  HER DAUGHTER Lynn Fleming TRANSLATING AND OPERATING THE [place].  CC: suicidal thoughts, depression, stress about covid   Subjective   Lynn Fleming is a 63 y.o. established patient. Telephone visit today for  HPI Major Depression with Passive SI Anxiety about Coronavirus Psychosomatic disorder History gathered from the patient's daughter Lynn Fleming Reports that patient has years of abdominal pain  She has lost about 20 pounds  She lost her appetite She does not have any appetite or taste for anything She is drinking water She is not going outside for fresh air and does not have any energy and is afraid that if she goes outside she thinks she will get coronovirus from the air She does much better when surrounded by family and engaged in conversation  But when she is at home with her husband she gets depressed She sleeps very little and does not nap in the day time.  She dozes off every now and again.  Her daughter states that there is always someone with her.  The patient reports that she gets stomach cramps that are painful  and can make her feel sick. Her daughter states that the patient keeps asking to go to the hospital.   Depression screen [facility] 2/9 08/03/2018 05/07/2018 05/07/2018 03/02/2018 12/28/2017  Decreased Interest 3 3 0 0 0  Down, Depressed, Hopeless 3 1 0 0 0  PHQ - 2 Score 6 4 0 0 0  Altered sleeping 3 3 0 - -  Tired, decreased energy 3 3 0 - -  Change in appetite 3 0 0 - -  Feeling bad or failure about yourself  3 1 0 - -  Trouble concentrating 2 0 0 - -  Moving slowly or fidgety/restless 3 0 0 - -  Suicidal thoughts 1 0 0 - -  PHQ-9 Score 24 11 0 - -  Difficult doing work/chores Very difficult Not difficult at all Not difficult at all - -   Hypertension She has been having high blood pressures and is checking her blood pressure at home very often She has not been sleeping She has not chest pains, no shortness of breath or palpitaitons BP Readings from Last 3 Encounters:  07/29/18 (!) 150/86  07/28/18 (!) 155/93  06/01/18 134/80   Medication refills Diabetes Mellitus - well controlled  She needs refills of her metformin She is taking the metformin bid as instructed She does not eat much  Wt Readings from Last 3 Encounters:  08/03/18 114 lb (51.7 kg)  06/01/18 128 lb (58.1 kg)  05/07/18 130 lb (59 kg)   Lab Results  Component Value Date   HGBA1C 6.7 (H) 03/02/2018  Patient Active Problem List   Diagnosis Date Noted  . Depression 03/15/2016  . Benign essential HTN 03/15/2016  . Scoliosis of lumbar spine 03/15/2016  . Degenerative disc disease, lumbar 03/15/2016  . Headache 12/12/2013  . TIA (transient ischemic attack) 12/12/2013    Past Medical History:  Diagnosis Date  . Hepatitis B   . Hypertension     Current Outpatient Medications  Medication Sig Dispense Refill  . atorvastatin (LIPITOR) 20 MG tablet Take 1 tablet (20 mg total) by mouth daily. 90 tablet 0  . EQ ALLERGY RELIEF, CETIRIZINE, 10 MG tablet Take 1 tablet by mouth once daily 90 tablet 0  . lisinopril  (ZESTRIL) 20 MG tablet Take 1 tablet (20 mg total) by mouth daily. 90 tablet 0  . Melatonin 5 MG TABS Take by mouth.    . metFORMIN (GLUCOPHAGE-XR) 750 MG 24 hr tablet Take 1 tablet (750 mg total) by mouth 2 (two) times daily. 180 tablet 0  . acetaminophen (TYLENOL 8 HOUR ARTHRITIS PAIN) 650 MG CR tablet Take 1 tablet (650 mg total) by mouth every 8 (eight) hours as needed for pain. 90 tablet 1  . clonazePAM (KLONOPIN) 0.5 MG tablet Take 1 tablet (0.5 mg total) by mouth 2 (two) times daily as needed for anxiety. 30 tablet 1  . FLUoxetine (PROZAC) 20 MG tablet Start by taking 1/2 tablet for one week then increase to one tablet by mouth daily. 90 tablet 3  . polyethylene glycol (MIRALAX) 17 g packet Take 17 g by mouth daily. (Patient not taking: Reported on 08/03/2018) 14 each 0  . traZODone (DESYREL) 50 MG tablet Take 1 tablet (50 mg total) by mouth at bedtime as needed for sleep. (Patient not taking: Reported on 08/03/2018) 30 tablet 3   No current facility-administered medications for this visit.     Allergies  Allergen Reactions  . Diclofenac     Abdominal pain    Social History   Socioeconomic History  . Marital status: Married    Spouse name: Not on file  . Number of children: Not on file  . Years of education: Not on file  . Highest education level: Not on file  Occupational History  . Not on file  Social Needs  . Financial resource strain: Not on file  . Food insecurity:    Worry: Not on file    Inability: Not on file  . Transportation needs:    Medical: Not on file    Non-medical: Not on file  Tobacco Use  . Smoking status: Never Smoker  . Smokeless tobacco: Never Used  Substance and Sexual Activity  . Alcohol use: No  . Drug use: No  . Sexual activity: Not Currently  Lifestyle  . Physical activity:    Days per week: Not on file    Minutes per session: Not on file  . Stress: Not on file  Relationships  . Social connections:    Talks on phone: Not on file     Gets together: Not on file    Attends religious service: Not on file    Active member of club or organization: Not on file    Attends meetings of clubs or organizations: Not on file    Relationship status: Not on file  . Intimate partner violence:    Fear of current or ex partner: Not on file    Emotionally abused: Not on file    Physically abused: Not on file    Forced sexual activity: Not  on file  Other Topics Concern  . Not on file  Social History Narrative  . Not on file    ROS Review of Systems  Constitutional: Negative for activity change, appetite change, chills and fever.  HENT: Negative for congestion, nosebleeds, trouble swallowing and voice change.   Respiratory: Negative for cough, shortness of breath and wheezing.   Gastrointestinal: Negative for diarrhea, nausea and vomiting. +intermittent chronic abdominal pain Genitourinary: Negative for difficulty urinating, dysuria, flank pain and hematuria.  Musculoskeletal: Negative for back pain, joint swelling and neck pain.  Neurological: Negative for dizziness, speech difficulty, light-headedness and numbness.  See HPI. All other review of systems negative.   Objective   Vitals as reported by the patient: Today's Vitals   08/03/18 0907  Weight: 114 lb (51.7 kg)  Height: 5\' 1"  (1.549 m)   TECHNIQUE: Multidetector CT imaging of the abdomen and pelvis was performed using the standard protocol following bolus administration of intravenous contrast.  CONTRAST:  121mL OMNIPAQUE 300  COMPARISON:  None.  FINDINGS: Lower chest: Lung bases are free of acute infiltrate or sizable effusion.  Hepatobiliary: Mild fatty infiltration of the liver is noted. Scattered cysts are noted within the liver. The gallbladder is well distended and within normal limits.  Pancreas: Unremarkable. No pancreatic ductal dilatation or surrounding inflammatory changes.  Spleen: Normal in size without focal abnormality.   Adrenals/Urinary Tract: Adrenal glands are within normal limits. Kidneys demonstrate no evidence of renal calculi. Bilateral renal cystic change is noted. No obstructive changes are seen. The bladder is partially distended.  Stomach/Bowel: The appendix is within normal limits. No obstructive or inflammatory changes of the larger small-bowel are seen. The stomach is within normal limits.  Vascular/Lymphatic: Aortic atherosclerosis. No enlarged abdominal or pelvic lymph nodes.  Reproductive: Uterus and bilateral adnexa are unremarkable.  Other: No abdominal wall hernia or abnormality. No abdominopelvic ascites.  Musculoskeletal: Degenerative changes of the lumbar spine are noted. Scoliosis concave to the right is noted. Chronic appearing L2 compression deformity is noted.  IMPRESSION: Chronic changes without acute abnormality.   Electronically Signed   By: Inez Catalina M.D.   On: 07/29/2018 02:49   Diagnoses and all orders for this visit:  Severe depression ([place])- discussed her mood and advised prozac and walks outside to the mailbox  Also advised vitamin D and a multivitamin  Essential hypertension- Will reassess bp after patient mental health is controlled -     lisinopril (ZESTRIL) 20 MG tablet; Take 1 tablet (20 mg total) by mouth daily.  Controlled type 2 diabetes mellitus without complication, without long-term current use of insulin ([place]) so advised to cut down on her metformin if she is not eating This might be causing the weight loss SENT IN REFILLS FOR WHEN PATIENT IS BEGINNING TO RESUME DIET -     metFORMIN (GLUCOPHAGE-XR) 750 MG 24 hr tablet; Take 1 tablet (750 mg total) by mouth 2 (two) times daily.  Mixed hyperlipidemia -     atorvastatin (LIPITOR) 20 MG tablet; Take 1 tablet (20 mg total) by mouth daily.  Educated About 2019 Novel Coronavirus Infection- discussed the risks of exposure for going to the ER Reviewed CT abdomen and asked pt to make  appts here before going to the ER Discussed that COVID is not airbone like pollen but from being in contact or within range of another person so fresh air is advised but not in close contact with others  Discussed wearing a mask  Psychosomatic disorder- will treat with  prn clonazepam  Loss of weight- stop metformin for now and resume when she begins to eat  Other orders -     FLUoxetine (PROZAC) 20 MG tablet; Start by taking 1/2 tablet for one week then increase to one tablet by mouth daily. -     clonazePAM (KLONOPIN) 0.5 MG tablet; Take 1 tablet (0.5 mg total) by mouth 2 (two) times daily as needed for anxiety. -     acetaminophen (TYLENOL 8 HOUR ARTHRITIS PAIN) 650 MG CR tablet; Take 1 tablet (650 mg total) by mouth every 8 (eight) hours as needed for pain.     I discussed the assessment and treatment plan with the patient. The patient was provided an opportunity to ask questions and all were answered. The patient agreed with the plan and demonstrated an understanding of the instructions.   The patient was advised to call back or seek an in-person evaluation if the symptoms worsen or if the condition fails to improve as anticipated.  I provided 30 minutes of non-face-to-face time during this encounter.  Forrest Moron, MD  Primary Care at [facility]

## 2018-08-30 ENCOUNTER — Ambulatory Visit: Payer: Self-pay | Admitting: Family Medicine

## 2018-08-31 ENCOUNTER — Ambulatory Visit (INDEPENDENT_AMBULATORY_CARE_PROVIDER_SITE_OTHER): Payer: Self-pay | Admitting: Family Medicine

## 2018-08-31 ENCOUNTER — Other Ambulatory Visit: Payer: Self-pay

## 2018-08-31 ENCOUNTER — Encounter: Payer: Self-pay | Admitting: Family Medicine

## 2018-08-31 VITALS — BP 132/82 | HR 84 | Temp 98.9°F | Resp 16 | Ht 61.0 in | Wt 112.0 lb

## 2018-08-31 DIAGNOSIS — E559 Vitamin D deficiency, unspecified: Secondary | ICD-10-CM

## 2018-08-31 DIAGNOSIS — E119 Type 2 diabetes mellitus without complications: Secondary | ICD-10-CM

## 2018-08-31 DIAGNOSIS — R627 Adult failure to thrive: Secondary | ICD-10-CM

## 2018-08-31 DIAGNOSIS — F322 Major depressive disorder, single episode, severe without psychotic features: Secondary | ICD-10-CM

## 2018-08-31 DIAGNOSIS — E876 Hypokalemia: Secondary | ICD-10-CM

## 2018-08-31 DIAGNOSIS — R202 Paresthesia of skin: Secondary | ICD-10-CM

## 2018-08-31 DIAGNOSIS — I10 Essential (primary) hypertension: Secondary | ICD-10-CM

## 2018-08-31 DIAGNOSIS — Z7189 Other specified counseling: Secondary | ICD-10-CM

## 2018-08-31 DIAGNOSIS — F411 Generalized anxiety disorder: Secondary | ICD-10-CM

## 2018-08-31 DIAGNOSIS — R200 Anesthesia of skin: Secondary | ICD-10-CM

## 2018-08-31 LAB — POCT GLYCOSYLATED HEMOGLOBIN (HGB A1C): Hemoglobin A1C: 6.2 % — AB (ref 4.0–5.6)

## 2018-08-31 MED ORDER — FLUOXETINE HCL 20 MG PO TABS: 40.0000 mg | ORAL_TABLET | Freq: Every day | ORAL | 0 refills | Status: AC

## 2018-08-31 MED ORDER — CLONAZEPAM 0.5 MG PO TABS: 0.5000 mg | ORAL_TABLET | Freq: Two times a day (BID) | ORAL | 1 refills | Status: DC | PRN

## 2018-08-31 NOTE — Patient Instructions (Addendum)
  Take vitamin D 2000 units twice a day  Vitamin D helps energy and concentration as well as immune system Your levels were 15 and you should have a vitamin D level above 30  Because you are not eating the metformin is too high of a dose and can cause weight loss and fatigue from dropping the sugars.  This puts your body into stress to keep up the sugars.  Continue to monitor your blood sugars and if your blood sugars are less than 140 then you are doing very well.   Because you are not eating well you potassium is low.  The potassium being low can cause muscle cramps and muscle twitching.  You have to take a multivitamin like centrum silver for women.   Today we are checking your vitamin B12 (we get this from meat, eggs).  Low B12 leads to fatigue, memory loss and numbness and tingling.  I will let you know the results of your labs  For anxiety and depression - take fluoxetine (prozac) 2 capsules once a day. When you finish the ones you have at home then pick up your new prescription.  Also walk for exercise and improve the mood.    If you have lab work done today you will be contacted with your lab results within the next 2 weeks.  If you have not heard from [place] then please contact us. The fastest way to get your results is to register for My Chart.   IF you received an x-ray today, you will receive an invoice from [facility]. Please contact [facility] at [phone] with questions or concerns regarding your invoice.   IF you received labwork today, you will receive an invoice from [facility]. Please contact [facility] at [phone] with questions or concerns regarding your invoice.   Our billing staff will not be able to assist you with questions regarding bills from these companies.  You will be contacted with the lab results as soon as they are available. The fastest way to get your results is to activate your My Chart account. Instructions are located on the last page  of this paperwork. If you have not heard from [place] regarding the results in 2 weeks, please contact this office.

## 2018-08-31 NOTE — Progress Notes (Signed)
Established Patient Office Visit  Subjective:  Patient ID: Lynn Fleming, female    DOB: [birth year]  Age: 63 y.o. MRN: [id number]  CC:  Chief Complaint  Patient presents with  . 1 month recheck depression    per pt klonopin is helping with her anxiety.  Every morning she has no energy and doesn't want to do anything but when she takes the medicine its like a magic pill and pt wants to know why it .  Pt is afraid to go out ? because of covid-19 and maybe if the MD tells her its ok to go out for fresh air.  Her health has declined and gone down hill since the pandemic started.  . Medication Refill    atorvastatin, klonopin, lisinopril, metformin and wants 90 day supply    Translator ID: [id number]  HPI [facility] presents for   She states that she does not have much taste for food She cannot eat She is very worried about coronara virus She states that when she eats she feels tired  When she stops eating the burning sensation resolves  She states that she is taking the trazodone but she wakes up multiple times at night and does not get good sleep. She is also taking melatonin for sleep She states that she does not have suicidal thoughts because she believes in the doctor and that medication can make her feel better.   Depression screen [facility] 2/9 08/31/2018 08/03/2018 05/07/2018 05/07/2018 03/02/2018  Decreased Interest 3 3 3  0 0  Down, Depressed, Hopeless 3 3 1  0 0  PHQ - 2 Score 6 6 4  0 0  Altered sleeping 3 3 3  0 -  Tired, decreased energy 3 3 3  0 -  Change in appetite 3 3 0 0 -  Feeling bad or failure about yourself  3 3 1  0 -  Trouble concentrating 3 2 0 0 -  Moving slowly or fidgety/restless 3 3 0 0 -  Suicidal thoughts 1 1 0 0 -  PHQ-9 Score 25 24 11  0 -  Difficult doing work/chores Very difficult Very difficult Not difficult at all Not difficult at all -     She reports that she is still taking her metformin and her bp medication She eats very little She reports  that she picks at her food and has very little appetite Wt Readings from Last 3 Encounters:  08/31/18 112 lb (50.8 kg)  08/03/18 114 lb (51.7 kg)  06/01/18 128 lb (58.1 kg)   She reports that she feels worried all the time and there is a sensation like her heart is beating very fast. She states that she is not having any pain but she feels very hot right now   She gets numbness in the tip of her fingers on both hands  When she gets very worried her bp goes up to 938H systolic but the klonopin helps her mood. She states that she doesn't know how the klonopin helps but it calms her down Without it she would not be able to sleep She lives with her daughter, husband and she still communicates by facetime with family.  Past Medical History:  Diagnosis Date  . Hepatitis B   . Hypertension     Past Surgical History:  Procedure Laterality Date  . ESOPHAGOGASTRODUODENOSCOPY N/A 09/30/2013   Procedure: ESOPHAGOGASTRODUODENOSCOPY (EGD);  Surgeon: Winfield Cunas., MD;  Location: [facility];  Service: Endoscopy;  Laterality: N/A;  . EUS  N/A 11/09/2013   Procedure: ESOPHAGEAL ENDOSCOPIC ULTRASOUND (EUS) RADIAL;  Surgeon: Arta Silence, MD;  Location: [facility];  Service: Endoscopy;  Laterality: N/A;    Family History  Problem Relation Age of Onset  . Colon cancer Neg Hx   . Stomach cancer Neg Hx   . Pancreatic cancer Neg Hx     Social History   Socioeconomic History  . Marital status: Married    Spouse name: Not on file  . Number of children: Not on file  . Years of education: Not on file  . Highest education level: Not on file  Occupational History  . Not on file  Social Needs  . Financial resource strain: Not on file  . Food insecurity:    Worry: Not on file    Inability: Not on file  . Transportation needs:    Medical: Not on file    Non-medical: Not on file  Tobacco Use  . Smoking status: Never Smoker  . Smokeless tobacco: Never Used  Substance and Sexual  Activity  . Alcohol use: No  . Drug use: No  . Sexual activity: Not Currently  Lifestyle  . Physical activity:    Days per week: Not on file    Minutes per session: Not on file  . Stress: Not on file  Relationships  . Social connections:    Talks on phone: Not on file    Gets together: Not on file    Attends religious service: Not on file    Active member of club or organization: Not on file    Attends meetings of clubs or organizations: Not on file    Relationship status: Not on file  . Intimate partner violence:    Fear of current or ex partner: Not on file    Emotionally abused: Not on file    Physically abused: Not on file    Forced sexual activity: Not on file  Other Topics Concern  . Not on file  Social History Narrative  . Not on file    Outpatient Medications Prior to Visit  Medication Sig Dispense Refill  . acetaminophen (TYLENOL 8 HOUR ARTHRITIS PAIN) 650 MG CR tablet Take 1 tablet (650 mg total) by mouth every 8 (eight) hours as needed for pain. 90 tablet 1  . atorvastatin (LIPITOR) 20 MG tablet Take 1 tablet (20 mg total) by mouth daily. 90 tablet 0  . EQ ALLERGY RELIEF, CETIRIZINE, 10 MG tablet Take 1 tablet by mouth once daily 90 tablet 0  . lisinopril (ZESTRIL) 20 MG tablet Take 1 tablet (20 mg total) by mouth daily. 90 tablet 0  . Melatonin 5 MG TABS Take by mouth.    . metFORMIN (GLUCOPHAGE-XR) 750 MG 24 hr tablet Take 1 tablet (750 mg total) by mouth 2 (two) times daily. 180 tablet 0  . clonazePAM (KLONOPIN) 0.5 MG tablet Take 1 tablet (0.5 mg total) by mouth 2 (two) times daily as needed for anxiety. 30 tablet 1  . FLUoxetine (PROZAC) 20 MG tablet Start by taking 1/2 tablet for one week then increase to one tablet by mouth daily. 90 tablet 3  . traZODone (DESYREL) 50 MG tablet Take 1 tablet (50 mg total) by mouth at bedtime as needed for sleep. (Patient not taking: Reported on 08/03/2018) 30 tablet 3  . polyethylene glycol (MIRALAX) 17 g packet Take 17 g by  mouth daily. (Patient not taking: Reported on 08/03/2018) 14 each 0   No facility-administered medications prior to visit.  Allergies  Allergen Reactions  . Diclofenac     Abdominal pain    ROS Review of Systems Review of Systems  Constitutional: Negative for activity change, appetite change, chills and fever.  HENT: Negative for congestion, nosebleeds, trouble swallowing and voice change.   Respiratory: Negative for cough, shortness of breath and wheezing.   Gastrointestinal: Negative for diarrhea, nausea and vomiting.  Genitourinary: Negative for difficulty urinating, dysuria, flank pain and hematuria.  Musculoskeletal: Negative for back pain, joint swelling and neck pain.  Neurological: Negative for dizziness, speech difficulty, light-headedness and numbness.  See HPI. All other review of systems negative.     Objective:    Physical Exam  BP 132/82 (BP Location: Right Arm, Patient Position: Sitting, Cuff Size: Normal)   Pulse 84   Temp 98.9 F (37.2 C) (Oral)   Resp 16   Ht 5' 1"  (1.549 m)   Wt 112 lb (50.8 kg)   SpO2 96%   BMI 21.16 kg/m  Wt Readings from Last 3 Encounters:  08/31/18 112 lb (50.8 kg)  08/03/18 114 lb (51.7 kg)  06/01/18 128 lb (58.1 kg)   Physical Exam  Constitutional: Oriented to person, place, and time. Appears well-developed and well-nourished.  HENT:  Head: Normocephalic and atraumatic.  Eyes: Conjunctivae and EOM are normal. No pallor of the conjunctiva  Cardiovascular: Normal rate, regular rhythm, normal heart sounds and intact distal pulses.  No murmur heard. Pulmonary/Chest: Effort normal and breath sounds normal. No stridor. No respiratory distress. Has no wheezes.  Neurological: Is alert and oriented to person, place, and time.  Skin: Skin is warm. Capillary refill takes less than 2 seconds. Palms of hands pink and warm. Psychiatric: Has a normal mood and affect. Behavior is normal. Judgment and thought content normal.    Health  Maintenance Due  Topic Date Due  . OPHTHALMOLOGY EXAM  06/23/1963  . HIV Screening  06/22/1968  . PAP SMEAR-Modifier  06/23/1974  . MAMMOGRAM  04/28/2008  . DEXA SCAN  06/23/2018  . PNA vac Low Risk Adult (1 of 2 - PCV13) 06/23/2018  . HEMOGLOBIN A1C  08/31/2018    There are no preventive care reminders to display for this patient.  Lab Results  Component Value Date   TSH 0.990 05/07/2018   Lab Results  Component Value Date   WBC 7.8 07/28/2018   HGB 13.1 07/28/2018   HCT 39.4 07/28/2018   MCV 95.2 07/28/2018   PLT 239 07/28/2018   Lab Results  Component Value Date   NA 140 07/28/2018   K 3.2 (L) 07/28/2018   CO2 23 07/28/2018   GLUCOSE 100 (H) 07/28/2018   BUN 7 (L) 07/28/2018   CREATININE 0.51 07/28/2018   BILITOT 1.1 07/28/2018   ALKPHOS 61 07/28/2018   AST 13 (L) 07/28/2018   ALT 8 07/28/2018   PROT 7.7 07/28/2018   ALBUMIN 4.6 07/28/2018   CALCIUM 9.8 07/28/2018   ANIONGAP 12 07/28/2018   Lab Results  Component Value Date   CHOL 120 03/02/2018   Lab Results  Component Value Date   HDL 37 (L) 03/02/2018   Lab Results  Component Value Date   LDLCALC 42 03/02/2018   Lab Results  Component Value Date   TRIG 205 (H) 03/02/2018   Lab Results  Component Value Date   CHOLHDL 3.2 03/02/2018   Lab Results  Component Value Date   HGBA1C 6.7 (H) 03/02/2018      Assessment & Plan:   Problem List Items Addressed This  Visit    None    Visit Diagnoses    Severe depression ([place])    -  Primary   Relevant Medications   clonazePAM (KLONOPIN) 0.5 MG tablet   FLUoxetine (PROZAC) 20 MG tablet   Other Relevant Orders   Ambulatory referral to Psychiatry   Vitamin B12   Adult failure to thrive       Relevant Orders   Ambulatory referral to Psychiatry   CBC   CMP14+EGFR   TSH   POCT glycosylated hemoglobin (Hb A1C)   Vitamin B12   Educated About Covid-19 Virus Infection       Controlled type 2 diabetes mellitus without complication, without  long-term current use of insulin (HCC)       Relevant Orders   POCT glycosylated hemoglobin (Hb A1C)   Vitamin B12   Numbness and tingling of hand       Essential hypertension       Vitamin D deficiency       Hypokalemia       Anxiety state       Relevant Medications   clonazePAM (KLONOPIN) 0.5 MG tablet   FLUoxetine (PROZAC) 20 MG tablet      Vitamin D deficiency-  Take vitamin D 2000 units twice a day  Vitamin D helps energy and concentration as well as immune system Your levels were 15 and you should have a vitamin D level above 30  Type 2 diabetes - Because you are not eating the metformin is too high of a dose and can cause weight loss and fatigue from dropping the sugars.  This puts your body into stress to keep up the sugars.  Continue to monitor your blood sugars and if your blood sugars are less than 140 then you are doing very well.   Hypokalemia -Because you are not eating well you potassium is low.  The potassium being low can cause muscle cramps and muscle twitching.  You have to take a multivitamin like centrum silver for women.   Numbness and tingling- Today we are checking your vitamin B12 (we get this from meat, eggs).  Low B12 leads to fatigue, memory loss and numbness and tingling.  I will let you know the results of your labs  For anxiety and depression - take fluoxetine (prozac) 2 capsules once a day. When you finish the ones you have at home then pick up your new prescription.  Also walk for exercise and improve the mood.   Meds ordered this encounter  Medications  . clonazePAM (KLONOPIN) 0.5 MG tablet    Sig: Take 1 tablet (0.5 mg total) by mouth 2 (two) times daily as needed for anxiety.    Dispense:  30 tablet    Refill:  1  . FLUoxetine (PROZAC) 20 MG tablet    Sig: Take 2 tablets (40 mg total) by mouth daily.    Dispense:  180 tablet    Refill:  0    Patient will double her 73m dose until she is due for a refill.    Follow-up: Return in about 4  weeks (around 09/28/2018) for depression.    ZForrest Moron MD

## 2018-09-01 ENCOUNTER — Encounter: Payer: Self-pay | Admitting: Family Medicine

## 2018-09-01 LAB — CMP14+EGFR
ALT: 5 IU/L (ref 0–32)
AST: 13 IU/L (ref 0–40)
Albumin/Globulin Ratio: 1.8 (ref 1.2–2.2)
Albumin: 4.8 g/dL (ref 3.8–4.8)
Alkaline Phosphatase: 55 IU/L (ref 39–117)
BUN/Creatinine Ratio: 19 (ref 12–28)
BUN: 9 mg/dL (ref 8–27)
Bilirubin Total: 0.7 mg/dL (ref 0.0–1.2)
CO2: 21 mmol/L (ref 20–29)
Calcium: 9.7 mg/dL (ref 8.7–10.3)
Chloride: 103 mmol/L (ref 96–106)
Creatinine, Ser: 0.47 mg/dL — ABNORMAL LOW (ref 0.57–1.00)
GFR calc Af Amer: 120 mL/min/{1.73_m2} (ref >59)
GFR calc non Af Amer: 104 mL/min/{1.73_m2} (ref >59)
Globulin, Total: 2.7 g/dL (ref 1.5–4.5)
Glucose: 101 mg/dL — ABNORMAL HIGH (ref 65–99)
Potassium: 4 mmol/L (ref 3.5–5.2)
Sodium: 140 mmol/L (ref 134–144)
Total Protein: 7.5 g/dL (ref 6.0–8.5)

## 2018-09-01 LAB — CBC
Hematocrit: 39.3 % (ref 34.0–46.6)
Hemoglobin: 13.5 g/dL (ref 11.1–15.9)
MCH: 32.5 pg (ref 26.6–33.0)
MCHC: 34.4 g/dL (ref 31.5–35.7)
MCV: 95 fL (ref 79–97)
Platelets: 281 10*3/uL (ref 150–450)
RBC: 4.16 x10E6/uL (ref 3.77–5.28)
RDW: 12.7 % (ref 11.7–15.4)
WBC: 6.5 10*3/uL (ref 3.4–10.8)

## 2018-09-01 LAB — TSH: TSH: 0.49 u[IU]/mL (ref 0.450–4.500)

## 2018-09-01 LAB — VITAMIN B12: Vitamin B-12: 283 pg/mL (ref 232–1245)

## 2018-09-02 ENCOUNTER — Telehealth: Payer: Self-pay

## 2018-09-02 NOTE — Telephone Encounter (Signed)
After I gave the pt the lab results. Pt was going on to talk about her sleep. She has been taking melatonin 5 mg for sleep and wants to know if this medication can be taken long term as a sleep aid. When she takes the melatonin it does help her sleep, but only for about 5hrs.   Pt has tried the trazodone that was suppose to help as a sleep aid, however when she takes it gives her nasal congestion and that makes her unable to sleep at night   Please advise if there is any additional recommendations on the sleep aid questions.

## 2018-09-28 ENCOUNTER — Ambulatory Visit: Payer: Self-pay | Admitting: Family Medicine

## 2018-10-05 ENCOUNTER — Other Ambulatory Visit: Payer: Self-pay

## 2018-10-05 ENCOUNTER — Encounter: Payer: Self-pay | Admitting: Family Medicine

## 2018-10-05 ENCOUNTER — Ambulatory Visit (INDEPENDENT_AMBULATORY_CARE_PROVIDER_SITE_OTHER): Payer: Self-pay | Admitting: Family Medicine

## 2018-10-05 VITALS — BP 118/73 | HR 71 | Temp 98.6°F | Resp 16 | Ht 61.0 in | Wt 111.6 lb

## 2018-10-05 DIAGNOSIS — I10 Essential (primary) hypertension: Secondary | ICD-10-CM

## 2018-10-05 DIAGNOSIS — F411 Generalized anxiety disorder: Secondary | ICD-10-CM

## 2018-10-05 DIAGNOSIS — F322 Major depressive disorder, single episode, severe without psychotic features: Secondary | ICD-10-CM

## 2018-10-05 DIAGNOSIS — E119 Type 2 diabetes mellitus without complications: Secondary | ICD-10-CM

## 2018-10-05 LAB — POCT GLYCOSYLATED HEMOGLOBIN (HGB A1C): Hemoglobin A1C: 6.2 % — AB (ref 4.0–5.6)

## 2018-10-05 MED ORDER — ZOLPIDEM TARTRATE 5 MG PO TABS: 5.0000 mg | ORAL_TABLET | Freq: Every day | ORAL | 1 refills | Status: AC

## 2018-10-05 MED ORDER — CLONAZEPAM 0.5 MG PO TABS: 0.5000 mg | ORAL_TABLET | Freq: Two times a day (BID) | ORAL | 1 refills | Status: AC | PRN

## 2018-10-05 NOTE — Progress Notes (Signed)
Established Patient Office Visit  Subjective:  Patient ID: Lynn Fleming, female    DOB: [birth year]  Age: 63 y.o. MRN: [id number]  CC:  Chief Complaint  Patient presents with  . Depression    follow up.  depression score of 14    HPI [place] presents for  Translator ID [id number]  Severe depression Follow up on severe depression  She states that she is is feeling better since taking the prozac and klonopin She reports that she is also taking klonopin 0.5mg  at bedtime when she feels tired. She is sleeping about 4 hours at night and she cannot fall into a deep sleep.  She states that she still feels like sleeping is hard for her Her hands feel numb and her back hurts as well.  Depression screen [facility] 2/9 10/05/2018 08/31/2018 08/03/2018 05/07/2018 05/07/2018  Decreased Interest 3 3 3 3  0  Down, Depressed, Hopeless 3 3 3 1  0  PHQ - 2 Score 6 6 6 4  0  Altered sleeping 3 3 3 3  0  Tired, decreased energy 3 3 3 3  0  Change in appetite 0 3 3 0 0  Feeling bad or failure about yourself  1 3 3 1  0  Trouble concentrating 1 3 2  0 0  Moving slowly or fidgety/restless 0 3 3 0 0  Suicidal thoughts 0 1 1 0 0  PHQ-9 Score 14 25 24 11  0  Difficult doing work/chores Not difficult at all Very difficult Very difficult Not difficult at all Not difficult at all  Some recent data might be hidden    Diabetes Type 2 She stopped the metformin for diabetes She reports that when she checks her sugars her readings are 110s She denies hypoglycemia She is sticking to a diabetic diet  Lab Results  Component Value Date   HGBA1C 6.2 (A) 10/05/2018     Past Medical History:  Diagnosis Date  . Hepatitis B   . Hypertension     Past Surgical History:  Procedure Laterality Date  . ESOPHAGOGASTRODUODENOSCOPY N/A 09/30/2013   Procedure: ESOPHAGOGASTRODUODENOSCOPY (EGD);  Surgeon: Winfield Cunas., MD;  Location: [facility];  Service: Endoscopy;  Laterality: N/A;  . EUS N/A 11/09/2013   Procedure: ESOPHAGEAL ENDOSCOPIC ULTRASOUND (EUS) RADIAL;  Surgeon: Arta Silence, MD;  Location: [facility];  Service: Endoscopy;  Laterality: N/A;    Family History  Problem Relation Age of Onset  . Colon cancer Neg Hx   . Stomach cancer Neg Hx   . Pancreatic cancer Neg Hx     Social History   Socioeconomic History  . Marital status: Married    Spouse name: Not on file  . Number of children: Not on file  . Years of education: Not on file  . Highest education level: Not on file  Occupational History  . Not on file  Social Needs  . Financial resource strain: Not on file  . Food insecurity    Worry: Not on file    Inability: Not on file  . Transportation needs    Medical: Not on file    Non-medical: Not on file  Tobacco Use  . Smoking status: Never Smoker  . Smokeless tobacco: Never Used  Substance and Sexual Activity  . Alcohol use: No  . Drug use: No  . Sexual activity: Not Currently  Lifestyle  . Physical activity    Days per week: Not on file    Minutes per session: Not on file  .  Stress: Not on file  Relationships  . Social Herbalist on phone: Not on file    Gets together: Not on file    Attends religious service: Not on file    Active member of club or organization: Not on file    Attends meetings of clubs or organizations: Not on file    Relationship status: Not on file  . Intimate partner violence    Fear of current or ex partner: Not on file    Emotionally abused: Not on file    Physically abused: Not on file    Forced sexual activity: Not on file  Other Topics Concern  . Not on file  Social History Narrative  . Not on file    Outpatient Medications Prior to Visit  Medication Sig Dispense Refill  . acetaminophen (TYLENOL 8 HOUR ARTHRITIS PAIN) 650 MG CR tablet Take 1 tablet (650 mg total) by mouth every 8 (eight) hours as needed for pain. 90 tablet 1  . atorvastatin (LIPITOR) 20 MG tablet Take 1 tablet (20 mg total) by mouth daily.  90 tablet 0  . EQ ALLERGY RELIEF, CETIRIZINE, 10 MG tablet Take 1 tablet by mouth once daily 90 tablet 0  . FLUoxetine (PROZAC) 20 MG tablet Take 2 tablets (40 mg total) by mouth daily. 180 tablet 0  . lisinopril (ZESTRIL) 20 MG tablet Take 1 tablet (20 mg total) by mouth daily. 90 tablet 0  . Melatonin 5 MG TABS Take by mouth.    . clonazePAM (KLONOPIN) 0.5 MG tablet Take 1 tablet (0.5 mg total) by mouth 2 (two) times daily as needed for anxiety. 30 tablet 1  . metFORMIN (GLUCOPHAGE-XR) 750 MG 24 hr tablet Take 1 tablet (750 mg total) by mouth 2 (two) times daily. (Patient not taking: Reported on 10/05/2018) 180 tablet 0  . traZODone (DESYREL) 50 MG tablet Take 1 tablet (50 mg total) by mouth at bedtime as needed for sleep. (Patient not taking: Reported on 08/03/2018) 30 tablet 3   No facility-administered medications prior to visit.     Allergies  Allergen Reactions  . Diclofenac     Abdominal pain    ROS Review of Systems Review of Systems  Constitutional: Negative for activity change, appetite change, chills and fever.  HENT: Negative for congestion, nosebleeds, trouble swallowing and voice change.   Respiratory: Negative for cough, shortness of breath and wheezing.   Gastrointestinal: Negative for diarrhea, nausea and vomiting.  Genitourinary: Negative for difficulty urinating, dysuria, flank pain and hematuria.  Musculoskeletal: Negative for back pain, joint swelling and neck pain.  Neurological: Negative for dizziness, speech difficulty, light-headedness and numbness.  See HPI. All other review of systems negative.     Objective:    Physical Exam  BP 118/73 (BP Location: Right Arm, Patient Position: Sitting, Cuff Size: Normal)   Pulse 71   Temp 98.6 F (37 C) (Oral)   Resp 16   Ht 5\' 1"  (1.549 m)   Wt 111 lb 9.6 oz (50.6 kg)   SpO2 99%   BMI 21.09 kg/m  Wt Readings from Last 3 Encounters:  10/05/18 111 lb 9.6 oz (50.6 kg)  08/31/18 112 lb (50.8 kg)  08/03/18 114  lb (51.7 kg)   Physical Exam  Constitutional: Oriented to person, place, and time. Appears well-developed and well-nourished.  HENT:  Head: Normocephalic and atraumatic.  Eyes: Conjunctivae and EOM are normal.  Cardiovascular: Normal rate, regular rhythm, normal heart sounds and intact distal pulses.  No murmur heard. Pulmonary/Chest: Effort normal and breath sounds normal. No stridor. No respiratory distress. Has no wheezes.  Neurological: Is alert and oriented to person, place, and time.  Skin: Skin is warm. Capillary refill takes less than 2 seconds.  Psychiatric: Has a normal mood and affect. Behavior is normal. Judgment and thought content normal.    Health Maintenance Due  Topic Date Due  . OPHTHALMOLOGY EXAM  06/23/1963  . HIV Screening  06/22/1968  . PAP SMEAR-Modifier  06/23/1974  . MAMMOGRAM  04/28/2008  . DEXA SCAN  06/23/2018  . PNA vac Low Risk Adult (1 of 2 - PCV13) 06/23/2018    There are no preventive care reminders to display for this patient.  Lab Results  Component Value Date   TSH 0.490 08/31/2018   Lab Results  Component Value Date   WBC 6.5 08/31/2018   HGB 13.5 08/31/2018   HCT 39.3 08/31/2018   MCV 95 08/31/2018   PLT 281 08/31/2018   Lab Results  Component Value Date   NA 140 08/31/2018   K 4.0 08/31/2018   CO2 21 08/31/2018   GLUCOSE 101 (H) 08/31/2018   BUN 9 08/31/2018   CREATININE 0.47 (L) 08/31/2018   BILITOT 0.7 08/31/2018   ALKPHOS 55 08/31/2018   AST 13 08/31/2018   ALT 5 08/31/2018   PROT 7.5 08/31/2018   ALBUMIN 4.8 08/31/2018   CALCIUM 9.7 08/31/2018   ANIONGAP 12 07/28/2018   Lab Results  Component Value Date   CHOL 120 03/02/2018   Lab Results  Component Value Date   HDL 37 (L) 03/02/2018   Lab Results  Component Value Date   LDLCALC 42 03/02/2018   Lab Results  Component Value Date   TRIG 205 (H) 03/02/2018   Lab Results  Component Value Date   CHOLHDL 3.2 03/02/2018   Lab Results  Component Value Date    HGBA1C 6.2 (A) 10/05/2018      Assessment & Plan:   Problem List Items Addressed This Visit    None    Visit Diagnoses    Essential hypertension    -  Primary Patient's blood pressure is at goal of 139/89 or less. Condition is stable. Continue current medications and treatment plan. I recommend that you exercise for 30-45 minutes 5 days a week. I also recommend a balanced diet with fruits and vegetables every day, lean meats, and little fried foods. The DASH diet (you can find this online) is a good example of this.    Type 2 diabetes mellitus without complication, without long-term current use of insulin (HCC)   - well controlled off metformin hemoglobin a1c is at goal and has maintained at 6.2% Continue exercise Advised [facility] 3 months follow up       Relevant Orders   POCT glycosylated hemoglobin (Hb A1C) (Completed)   Major depressive disorder, single episode, severe (HCC)    -  Improved with prozac and better sleep   Severe depression (HCC)       Relevant Medications   clonazePAM (KLONOPIN) 0.5 MG tablet   Anxiety state    -  Pt with anxiety that is improving  Will do a small refill of clonazepam Will add ambien for sleep   Relevant Medications   clonazePAM (KLONOPIN) 0.5 MG tablet      Meds ordered this encounter  Medications  . zolpidem (AMBIEN) 5 MG tablet    Sig: Take 1 tablet (5 mg total) by mouth at bedtime.  Dispense:  30 tablet    Refill:  1  . clonazePAM (KLONOPIN) 0.5 MG tablet    Sig: Take 1 tablet (0.5 mg total) by mouth 2 (two) times daily as needed for anxiety.    Dispense:  10 tablet    Refill:  1    Follow-up: No follow-ups on file.    Forrest Moron, MD

## 2018-10-05 NOTE — Patient Instructions (Addendum)
Ambien:  Take one tablet by mouth before bedtime. Give yourself at least 10 hours of sleep time. Some people wake up dizzy and groggy. If you wake up dizzy just go back to bed and rest until the feeling passes. You may take 1/2 tablet to one tablet as needed.     If you have lab work done today you will be contacted with your lab results within the next 2 weeks.  If you have not heard from [place] then please contact us. The fastest way to get your results is to register for My Chart.   IF you received an x-ray today, you will receive an invoice from [facility]. Please contact [facility] at [phone] with questions or concerns regarding your invoice.   IF you received labwork today, you will receive an invoice from [facility]. Please contact [facility] at [phone] with questions or concerns regarding your invoice.   Our billing staff will not be able to assist you with questions regarding bills from these companies.  You will be contacted with the lab results as soon as they are available. The fastest way to get your results is to activate your My Chart account. Instructions are located on the last page of this paperwork. If you have not heard from [place] regarding the results in 2 weeks, please contact this office.

## 2018-10-25 ENCOUNTER — Telehealth: Payer: Self-pay | Admitting: Family Medicine

## 2018-10-25 NOTE — Telephone Encounter (Signed)
Medication Refill - Medication: atorvastatin (LIPITOR) 20 MG tablet [[id number]] lisinopril (ZESTRIL) 20 MG tablet [[id number]]    Has the patient contacted their pharmacy? No. (Agent: If no, request that the patient contact the pharmacy for the refill.) (Agent: If yes, when and what did the pharmacy advise?) [place], [place]. [phone] (Phone) [phone] (Fax)     Preferred Pharmacy (with phone number or street name):  Agent: Please be advised that RX refills may take up to 3 business days. We ask that you follow-up with your pharmacy.

## 2018-10-28 NOTE — Telephone Encounter (Signed)
Patient is calling to get the status of her refill request.  Please advise and call patient when she can pick up the medication.  CB# [phone]

## 2018-10-30 ENCOUNTER — Other Ambulatory Visit: Payer: Self-pay

## 2018-10-30 DIAGNOSIS — E782 Mixed hyperlipidemia: Secondary | ICD-10-CM

## 2018-10-30 DIAGNOSIS — I10 Essential (primary) hypertension: Secondary | ICD-10-CM

## 2018-10-30 MED ORDER — ATORVASTATIN CALCIUM 20 MG PO TABS: 20.0000 mg | ORAL_TABLET | Freq: Every day | ORAL | 0 refills | Status: AC

## 2018-10-30 MED ORDER — LISINOPRIL 20 MG PO TABS: 20.0000 mg | ORAL_TABLET | Freq: Every day | ORAL | 0 refills | Status: AC

## 2018-10-30 NOTE — Telephone Encounter (Signed)
Error pt not contacted due to language barrier and interpreter needed will contact pt on Monday 11/01/2018 to advise meds have been filled. Dgaddy, CMA

## 2018-10-30 NOTE — Telephone Encounter (Signed)
Lipitor 20 mg #90 with 0 refills and lisinopril 20 mg #90 with 0 refills sent to [facility] on [address] per telephone note. Pt notified medication sent to pharmacy and agreeable.  Pt last ov 10/05/2018. Dgaddy, CMA

## 2019-01-07 ENCOUNTER — Ambulatory Visit: Payer: Self-pay | Admitting: Family Medicine

## 2019-01-10 ENCOUNTER — Encounter: Payer: Self-pay | Admitting: Family Medicine

## 2019-06-25 ENCOUNTER — Ambulatory Visit: Payer: Medicaid Other | Attending: Internal Medicine

## 2019-06-25 DIAGNOSIS — Z23 Encounter for immunization: Secondary | ICD-10-CM

## 2019-06-25 NOTE — Progress Notes (Signed)
   Covid-19 Vaccination Clinic  Name:  Lynn Fleming    MRN: [id number] DOB: 05/24/1955  06/25/2019  Lynn Fleming was observed post Covid-19 immunization for 15 minutes without incident. She was provided with Vaccine Information Sheet and instruction to access the V-Safe system.   Lynn Fleming was instructed to call 911 with any severe reactions post vaccine: Marland Kitchen Difficulty breathing  . Swelling of face and throat  . A fast heartbeat  . A bad rash all over body  . Dizziness and weakness   Immunizations Administered    Name Date Dose VIS Date Route   Pfizer COVID-19 Vaccine 06/25/2019 12:42 PM 0.3 mL 03/25/2019 Intramuscular   Manufacturer: [place]   Lot: [id number]   [place]: [id number]

## 2019-07-18 ENCOUNTER — Ambulatory Visit: Payer: Medicaid Other

## 2019-07-19 ENCOUNTER — Ambulatory Visit: Payer: Medicaid Other | Attending: Internal Medicine

## 2019-07-19 DIAGNOSIS — Z23 Encounter for immunization: Secondary | ICD-10-CM

## 2019-07-19 NOTE — Progress Notes (Signed)
   Covid-19 Vaccination Clinic  Name:  Shannin Mayo    MRN: [id number] DOB: 1955/05/25  07/19/2019  Ms. Troxler was observed post Covid-19 immunization for 15 minutes without incident. She was provided with Vaccine Information Sheet and instruction to access the V-Safe system.   Ms. Gutierres was instructed to call 911 with any severe reactions post vaccine: Marland Kitchen Difficulty breathing  . Swelling of face and throat  . A fast heartbeat  . A bad rash all over body  . Dizziness and weakness   Immunizations Administered    Name Date Dose VIS Date Route   Pfizer COVID-19 Vaccine 07/19/2019  1:47 PM 0.3 mL 03/25/2019 Intramuscular   Manufacturer: [place]   Lot: [id number]   [place]: [id number]

## 2019-11-24 IMAGING — DX DG LUMBAR SPINE COMPLETE 4+V
5 series · 5 of 5 positions shown · non-contrast
Comparison: 11/06/2014

CLINICAL DATA: Acute on chronic back pain, no known injury, initial
encounter

EXAM:
LUMBAR SPINE - COMPLETE 4+ VIEW

[l-spine ap]
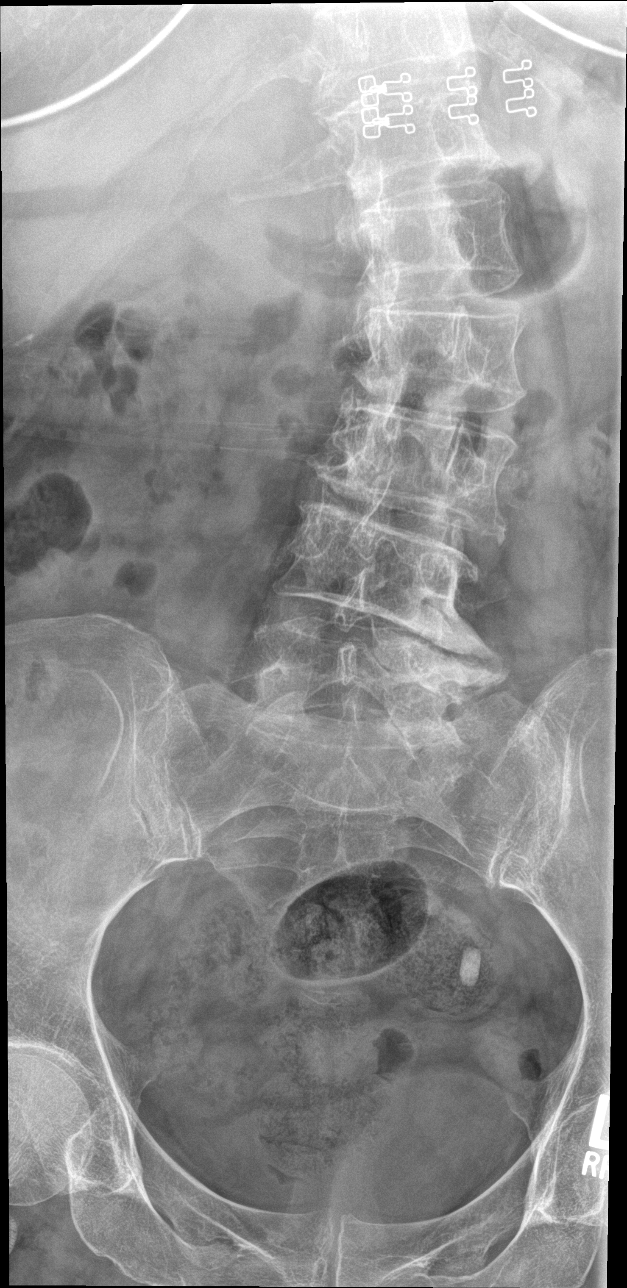

[l-spine obl (1 of 2)]
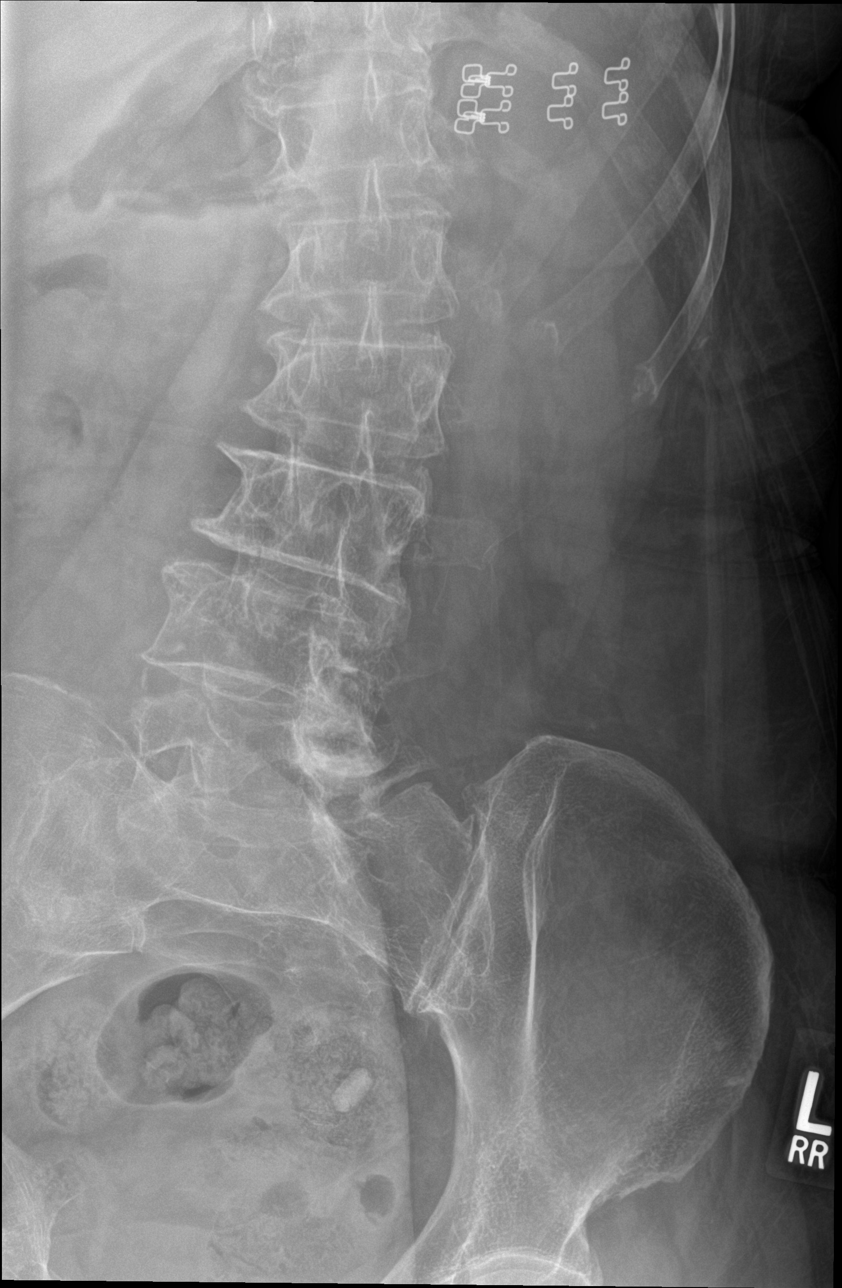

[l-spine obl (2 of 2)]
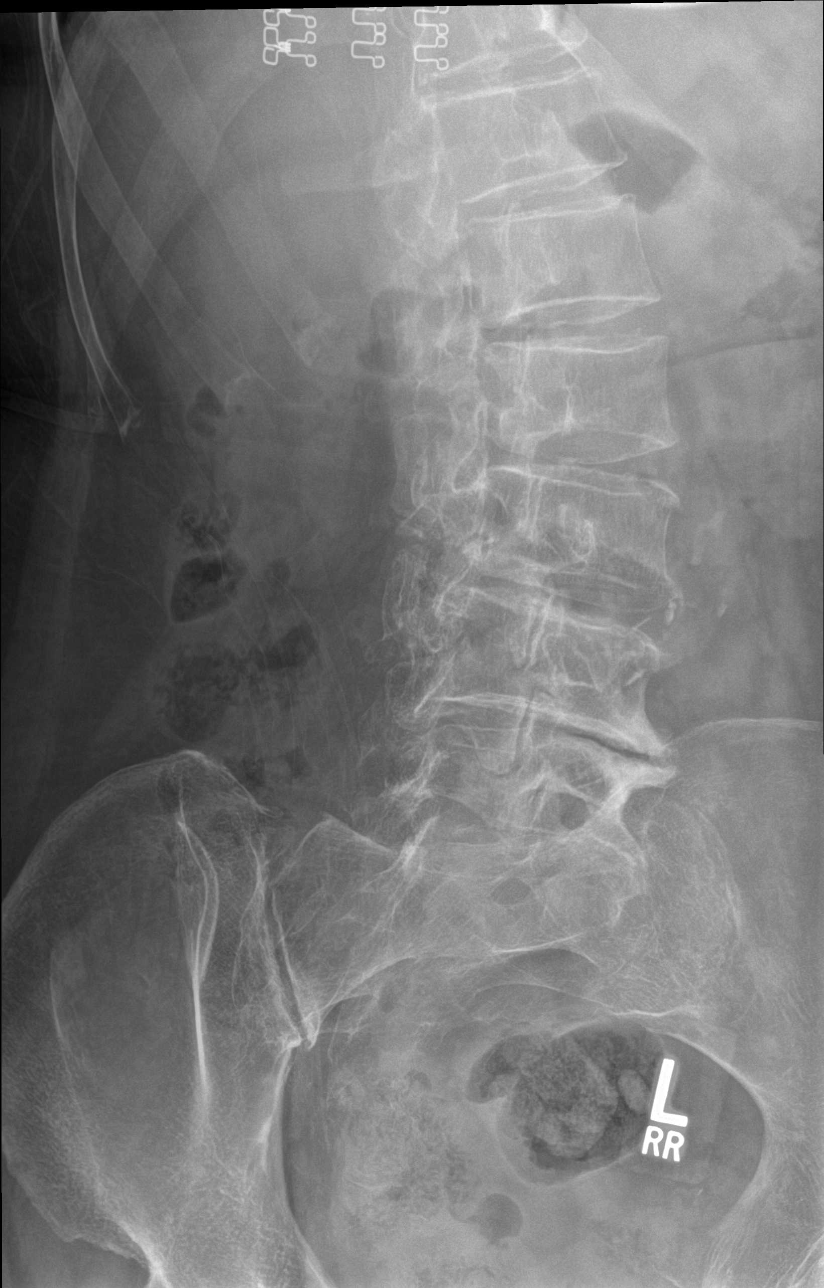

[l-spine lat]
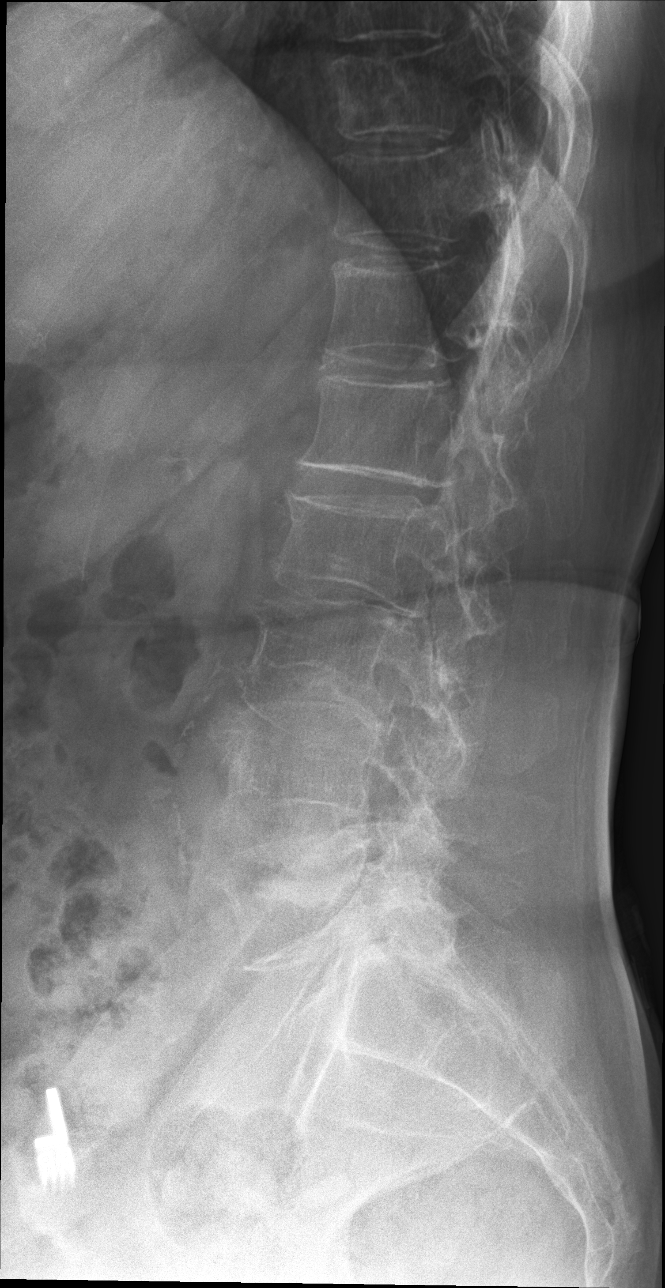

[l-spine l5-s1]
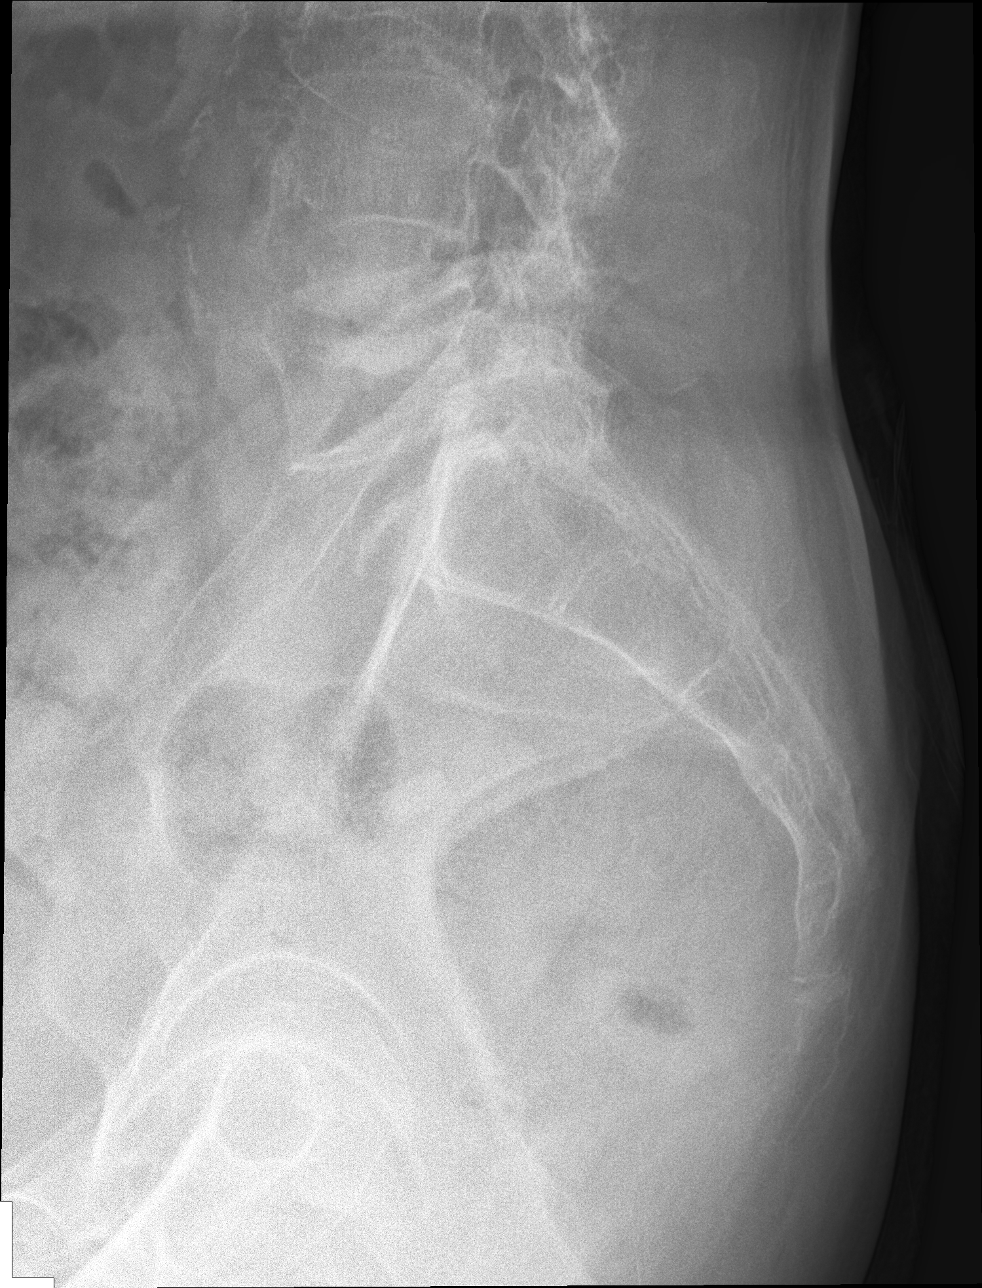

[5 of 5 positions shown; findings below may reference images not displayed]

FINDINGS: Five lumbar type vertebral bodies are well visualized. Vertebral
body height is well maintained with the exception of L2 stable from
the prior exam and somewhat accentuated by the scoliosis. Mild
scoliosis concave to the right is again seen and stable.
Compensatory osteophytic changes are also noted stable from the
prior exam. No pars defects are seen. Mild facet hypertrophic
changes are noted.
IMPRESSION: Chronic changes without acute abnormality.

## 2023-02-21 ENCOUNTER — Other Ambulatory Visit: Payer: Self-pay

## 2023-02-21 ENCOUNTER — Emergency Department (HOSPITAL_BASED_OUTPATIENT_CLINIC_OR_DEPARTMENT_OTHER)
Admission: EM | Admit: 2023-02-21 | Discharge: 2023-02-21 | Disposition: A | Discharge status: Home / Self Care | Payer: Medicaid Other | Attending: Emergency Medicine | Admitting: Emergency Medicine

## 2023-02-21 ENCOUNTER — Encounter (HOSPITAL_BASED_OUTPATIENT_CLINIC_OR_DEPARTMENT_OTHER): Payer: Self-pay | Admitting: Emergency Medicine

## 2023-02-21 ENCOUNTER — Emergency Department (HOSPITAL_BASED_OUTPATIENT_CLINIC_OR_DEPARTMENT_OTHER): Payer: Medicaid Other

## 2023-02-21 DIAGNOSIS — Z79899 Other long term (current) drug therapy: Secondary | ICD-10-CM | POA: Diagnosis not present

## 2023-02-21 DIAGNOSIS — J029 Acute pharyngitis, unspecified: Secondary | ICD-10-CM | POA: Diagnosis not present

## 2023-02-21 DIAGNOSIS — I10 Essential (primary) hypertension: Secondary | ICD-10-CM | POA: Diagnosis not present

## 2023-02-21 DIAGNOSIS — R519 Headache, unspecified: Secondary | ICD-10-CM | POA: Diagnosis present

## 2023-02-21 DIAGNOSIS — Z20822 Contact with and (suspected) exposure to covid-19: Secondary | ICD-10-CM | POA: Diagnosis not present

## 2023-02-21 LAB — CBC WITH DIFFERENTIAL/PLATELET
Abs Immature Granulocytes: 0.04 10*3/uL (ref 0.00–0.07)
Basophils Absolute: 0 10*3/uL (ref 0.0–0.1)
Basophils Relative: 0 %
Eosinophils Absolute: 0.1 10*3/uL (ref 0.0–0.5)
Eosinophils Relative: 1 %
HCT: 35.1 % — ABNORMAL LOW (ref 36.0–46.0)
Hemoglobin: 11.8 g/dL — ABNORMAL LOW (ref 12.0–15.0)
Immature Granulocytes: 0 %
Lymphocytes Relative: 18 %
Lymphs Abs: 2 10*3/uL (ref 0.7–4.0)
MCH: 32.1 pg (ref 26.0–34.0)
MCHC: 33.6 g/dL (ref 30.0–36.0)
MCV: 95.4 fL (ref 80.0–100.0)
Monocytes Absolute: 0.7 10*3/uL (ref 0.1–1.0)
Monocytes Relative: 7 %
Neutro Abs: 8.3 10*3/uL — ABNORMAL HIGH (ref 1.7–7.7)
Neutrophils Relative %: 74 %
Platelets: 278 10*3/uL (ref 150–400)
RBC: 3.68 MIL/uL — ABNORMAL LOW (ref 3.87–5.11)
RDW: 11.9 % (ref 11.5–15.5)
WBC: 11.1 10*3/uL — ABNORMAL HIGH (ref 4.0–10.5)
nRBC: 0 % (ref 0.0–0.2)

## 2023-02-21 LAB — BASIC METABOLIC PANEL
Anion gap: 13 (ref 5–15)
BUN: 16 mg/dL (ref 8–23)
CO2: 24 mmol/L (ref 22–32)
Calcium: 9.6 mg/dL (ref 8.9–10.3)
Chloride: 96 mmol/L — ABNORMAL LOW (ref 98–111)
Creatinine, Ser: 0.75 mg/dL (ref 0.44–1.00)
GFR, Estimated: >60 mL/min (ref >60)
Glucose, Bld: 127 mg/dL — ABNORMAL HIGH (ref 70–99)
Potassium: 4.2 mmol/L (ref 3.5–5.1)
Sodium: 133 mmol/L — ABNORMAL LOW (ref 135–145)

## 2023-02-21 LAB — RESP PANEL BY RT-PCR (RSV, FLU A&B, COVID)  RVPGX2
Influenza A by PCR: NEGATIVE
Influenza B by PCR: NEGATIVE
Resp Syncytial Virus by PCR: NEGATIVE
SARS Coronavirus 2 by RT PCR: NEGATIVE

## 2023-02-21 LAB — TROPONIN I (HIGH SENSITIVITY)
Troponin I (High Sensitivity): 3 ng/L (ref <18)
Troponin I (High Sensitivity): 2 ng/L (ref <18)

## 2023-02-21 LAB — GROUP A STREP BY PCR: Group A Strep by PCR: NOT DETECTED

## 2023-02-21 MED ORDER — PROCHLORPERAZINE MALEATE 10 MG PO TABS
10.0000 mg | ORAL_TABLET | Freq: Once | ORAL | Status: AC
  Administered 2023-02-21: 10 mg via ORAL
  Filled 2023-02-21: qty 1

## 2023-02-21 MED ORDER — AMOXICILLIN-POT CLAVULANATE 875-125 MG PO TABS
1.0000 | ORAL_TABLET | Freq: Once | ORAL | Status: AC
  Administered 2023-02-21: 1 via ORAL
  Filled 2023-02-21: qty 1

## 2023-02-21 MED ORDER — DIPHENHYDRAMINE HCL 25 MG PO CAPS
25.0000 mg | ORAL_CAPSULE | Freq: Once | ORAL | Status: AC
Start: 2023-02-21 — End: 2023-02-21
  Administered 2023-02-21: 25 mg via ORAL
  Filled 2023-02-21: qty 1

## 2023-02-21 MED ORDER — AMOXICILLIN-POT CLAVULANATE 875-125 MG PO TABS: 1.0000 | ORAL_TABLET | Freq: Two times a day (BID) | ORAL | 0 refills | Status: AC

## 2023-02-21 NOTE — ED Provider Notes (Signed)
[place] [facility] Provider Note   CSN: [id number] Arrival date & time: 02/21/23  1559     History  Chief Complaint  Patient presents with   Headache   Chest Pain    Lynn Fleming is a 67 y.o. female history of TIA, hypertension presented with headache and chest pain that began last night.  Patient states that headache is like a band around her head and has been constant but denies being sudden and maximal in onset.  Patient has any vision changes, new onset weakness with it but does note that she initially had a chest discomfort with that and that comes and goes but thinks is related to anxiety she did become anxious with a headache.  Tylenol did not help at home.  Patient was able to walk without issue and denies any fevers, neck pain/stiffness, shortness of breath.  [place] interpreter [phone]  Home Medications Prior to Admission medications   Medication Sig Start Date End Date Taking? Authorizing Provider  amoxicillin-clavulanate (AUGMENTIN) 875-125 MG tablet Take 1 tablet by mouth every 12 (twelve) hours for 5 days. 02/21/23 02/26/23 Yes Rhenda [place], Beverly Gust, PA-C  acetaminophen (TYLENOL 8 HOUR ARTHRITIS PAIN) 650 MG CR tablet Take 1 tablet (650 mg total) by mouth every 8 (eight) hours as needed for pain. 08/03/18   Doristine Bosworth, MD  atorvastatin (LIPITOR) 20 MG tablet Take 1 tablet (20 mg total) by mouth daily. 10/30/18   Doristine Bosworth, MD  clonazePAM (KLONOPIN) 0.5 MG tablet Take 1 tablet (0.5 mg total) by mouth 2 (two) times daily as needed for anxiety. 10/05/18   Doristine Bosworth, MD  EQ ALLERGY RELIEF, CETIRIZINE, 10 MG tablet Take 1 tablet by mouth once daily 07/05/18   Collie Siad A, MD  FLUoxetine (PROZAC) 20 MG tablet Take 2 tablets (40 mg total) by mouth daily. 08/31/18   Doristine Bosworth, MD  lisinopril (ZESTRIL) 20 MG tablet Take 1 tablet (20 mg total) by mouth daily. 10/30/18   Doristine Bosworth, MD  Melatonin 5 MG TABS Take  by mouth.    [provider]  zolpidem (AMBIEN) 5 MG tablet Take 1 tablet (5 mg total) by mouth at bedtime. 10/05/18   Doristine Bosworth, MD      Allergies    Diclofenac    Review of Systems   Review of Systems  Cardiovascular:  Positive for chest pain.  Neurological:  Positive for headaches.    Physical Exam Updated Vital Signs BP 116/89   Pulse 80   Temp 97.7 F (36.5 C)   Resp 18   Ht 5' (1.524 m)   Wt 59 kg   SpO2 99%   BMI 25.39 kg/m  Physical Exam Vitals reviewed.  Constitutional:      General: She is not in acute distress.    Appearance: She is normal weight.  HENT:     Head: Normocephalic and atraumatic.     Jaw: There is normal jaw occlusion.     Salivary Glands: Right salivary gland is not diffusely enlarged or tender. Left salivary gland is not diffusely enlarged or tender.     Comments: No temporal artery tenderness No jaw claudication No sinus tenderness    Right Ear: Hearing, tympanic membrane, ear canal and external ear normal.     Left Ear: Hearing, tympanic membrane, ear canal and external ear normal.     Nose: Nose normal.     Mouth/Throat:  Lips: Pink.     Mouth: Mucous membranes are moist.     Pharynx: Oropharynx is clear. Uvula midline.     Comments: No oral floor swelling Tolerating secretions No purulent drainage no No PTA noted No muffled voice noted Eyes:     Extraocular Movements: Extraocular movements intact.     Conjunctiva/sclera: Conjunctivae normal.     Pupils: Pupils are equal, round, and reactive to light.  Neck:     Comments: No neck swelling Cardiovascular:     Rate and Rhythm: Normal rate and regular rhythm.     Pulses: Normal pulses.     Heart sounds: Normal heart sounds.     Comments: 2+ bilateral radial/dorsalis pedis pulses with regular rate Pulmonary:     Effort: Pulmonary effort is normal. No respiratory distress.     Breath sounds: Normal breath sounds.  Abdominal:     Palpations: Abdomen is soft.      Tenderness: There is no abdominal tenderness. There is no guarding or rebound.  Musculoskeletal:        General: Normal range of motion.     Cervical back: Normal range of motion and neck supple. No rigidity or tenderness.     Comments: 5 out of 5 bilateral grip/leg extension strength  Skin:    General: Skin is warm and dry.     Capillary Refill: Capillary refill takes less than 2 seconds.  Neurological:     General: No focal deficit present.     Mental Status: She is alert and oriented to person, place, and time.     Sensory: Sensation is intact.     Motor: Motor function is intact.     Coordination: Coordination is intact.     Gait: Gait is intact.     Comments: Sensation intact in all 4 limbs Vision grossly intact Cranial nerves III through XII intact  Psychiatric:        Mood and Affect: Mood normal.     ED Results / Procedures / Treatments   Labs (all labs ordered are listed, but only abnormal results are displayed) Labs Reviewed  BASIC METABOLIC PANEL - Abnormal; Notable for the following components:      Result Value   Sodium 133 (*)    Chloride 96 (*)    Glucose, Bld 127 (*)    All other components within normal limits  CBC WITH DIFFERENTIAL/PLATELET - Abnormal; Notable for the following components:   WBC 11.1 (*)    RBC 3.68 (*)    Hemoglobin 11.8 (*)    HCT 35.1 (*)    Neutro Abs 8.3 (*)    All other components within normal limits  RESP PANEL BY RT-PCR (RSV, FLU A&B, COVID)  [id number]  GROUP A STREP BY PCR  TROPONIN I (HIGH SENSITIVITY)  TROPONIN I (HIGH SENSITIVITY)    EKG EKG Interpretation Date/Time:  Saturday February 21 2023 16:18:08 EST Ventricular Rate:  83 PR Interval:  143 QRS Duration:  92 QT Interval:  411 QTC Calculation: 483 R Axis:   67  Text Interpretation: Sinus rhythm Confirmed by Glyn Ade [phone]) on 02/21/2023 10:19:34 PM  Radiology CT Head Wo Contrast  Result Date: 02/21/2023 CLINICAL DATA:  Headache and chest pain since  last night. EXAM: CT HEAD WITHOUT CONTRAST TECHNIQUE: Contiguous axial images were obtained from the base of the skull through the vertex without intravenous contrast. RADIATION DOSE REDUCTION: This exam was performed according to the departmental dose-optimization program which includes automated exposure control, adjustment of  the mA and/or kV according to patient size and/or use of iterative reconstruction technique. COMPARISON:  12/11/2013 FINDINGS: Brain: No mass lesion, hemorrhage, hydrocephalus, acute infarct, intra-axial, or extra-axial fluid collection. Vascular: No hyperdense vessel or unexpected calcification. Skull: Hyperostosis frontalis interna. Sinuses/Orbits: Clear paranasal sinuses and mastoid air cells. Left sphenoid sinus opacification. Mucosal thickening and fluid in left ethmoid air cells, increased. Mild right sphenoid sinus mucosal thickening, new. Clear mastoid air cells. Other: None. IMPRESSION: 1.  No acute intracranial abnormality. 2. Extensive sinus disease, progressive since 2015. Electronically Signed   By: Jeronimo Greaves M.D.   On: 02/21/2023 18:06   DG Chest 2 View  Result Date: 02/21/2023 CLINICAL DATA:  Chest pain and headache since last night. Difficulty sleeping due to pain. EXAM: CHEST - 2 VIEW COMPARISON:  06/27/2013 FINDINGS: Heart size and mediastinal contours appear normal. There is no pleural fluid or interstitial edema. Mild scarring along the left costophrenic angle. No airspace consolidation identified. Levoscoliosis of the thoracic spine. IMPRESSION: 1. No acute cardiopulmonary disease. 2. Levoscoliosis of the thoracic spine. Electronically Signed   By: Signa Kell M.D.   On: 02/21/2023 18:01    Procedures Procedures    Medications Ordered in ED Medications  prochlorperazine (COMPAZINE) tablet 10 mg (10 mg Oral Given 02/21/23 2055)  diphenhydrAMINE (BENADRYL) capsule 25 mg (25 mg Oral Given 02/21/23 2054)  amoxicillin-clavulanate (AUGMENTIN) 875-125 MG per  tablet 1 tablet (1 tablet Oral Given 02/21/23 2231)    ED Course/ Medical Decision Making/ A&P Clinical Course as of 02/21/23 2237  Sat Feb 21, 2023  2218 Stable headache and chest pain since last night. No improvement with tylenol Typical antiheadache treatment Systemic symptoms likely viral in etiology. [CC]    Clinical Course User Index [CC] Glyn Ade, MD                                 Medical Decision Making Amount and/or Complexity of Data Reviewed Labs: ordered. Radiology: ordered.  Risk Prescription drug management.   Chelsea Aus Thi [facility] 67 y.o. presented today for headache, chest pain. Working DDx that I considered at this time includes, but not limited to, tension headache, migraine, intracranial mass, intracranial hemorrhage, intracranial infection including meningitis vs encephalitis, GCA, trigeminal neuralgia, AVM, sinusitis, cerebral aneurysm, muscular headache, cavernous sinus thrombosis, carotid artery dissection, anxiety, ACS, PE, tamponade.  R/o DDx: intracranial mass, intracranial hemorrhage, intracranial infection including meningitis vs encephalitis, GCA, trigeminal neuralgia, preeclampsia/eclampsia, AVM, sinusitis, cerebral aneurysm, muscular headache, cavernous sinus thrombosis, carotid artery dissection:  less likely due to history of present illness, physical exam, labs/imaging findings  Review of prior external notes: 02/20/2023 failure medicine  Unique Tests and My Interpretation:  CBC: Mild leukocytosis 11.1 BMP: Unremarkable Troponin: 3, 2 Respiratory panel: Negative Group A strep: Not detected Chest x-ray: Unremarkable EKG: Sinus 83 bpm, no ST elevations or depressions noted, no blocks noted CT Head w/o Contrast: Extensive sinus disease  Social Determinants of Health: none  Discussion with Independent Historian:  Daughter  Discussion of Management of Tests: None  Risk: Medium: prescription drug management  Risk Stratification Score:  None  Staffed with [place], MD  Plan: On exam patient was in no acute distress with stable vitals.  Patient's exam was unremarkable including a neurologic exam.  Patient CT from triage does show extensive sinus disease that could be contributing to her headache and patient was describing headache as a bandlike and not sudden and maximal  onset and so very low suspicion of any life-threatening diagnoses at this time.  Patient was given Compazine and Benadryl for the headache.  Patient said that she started getting chest discomfort after having a headache last night but that it is constant however patient thinks that is from her anxiety as she has become very anxious about the headache.  Patient troponins are negative and EKG is reassuring.  Patient's labs and imaging were all reassuring.  I rechecked patient was doing better after receiving the headache cocktail and so I do suspect this could be sinus related.  After speaking the attending we agreed to give 1 dose of Augmentin here and prescribed the rest and have her follow-up with the ENT specialist given the CT showing extensive sinus pathology.  At time of discharge patient stated that she was comfortable with the plan and states that she wants to be discharged.  Will give primary care follow-up as well as is unclear whether or not patient does have a primary care provider.  Do suspect patient has viral illness causing her sore throat that could be also causing the headache as well but given the leukocytosis, elderly age, abnormality found on CT scan we will give the Augmentin and have her follow-up with ENT.  Patient was given return precautions. Patient stable for discharge at this time.  Patient verbalized understanding of plan.  This chart was dictated using voice recognition software.  Despite best efforts to proofread,  errors can occur which can change the documentation meaning.         Final Clinical Impression(s) / ED  Diagnoses Final diagnoses:  Bad headache  Viral pharyngitis    Rx / DC Orders ED Discharge Orders          Ordered    amoxicillin-clavulanate (AUGMENTIN) 875-125 MG tablet  Every 12 hours        02/21/23 2221              Netta Corrigan, PA-C 02/21/23 2237    Glyn Ade, MD 02/21/23 2317

## 2023-02-21 NOTE — Discharge Instructions (Signed)
Vui lng lin h? v?i nh cung c?p d?ch v? ch?m [place] chnh c?a b?n b?n 1 m ti ? ?nh km ? ?y cng v?i [place] gia tai m?i h?ng ? ?nh km thng tin v? l?n khm c?p c?u g?n ?y c?a b?n.  Hm nay k?t qu? xt nghi?m v hnh ?nh c?a b?n ??u yn tm tuy nhin k?t qu? ch?p CT cho th?y b?n c th? m?c b?nh xoang ?ang di?n ra khi?n b?n ?au ??u v v v?y b?n ???c tim 1 li?u khng sinh t?i ?y v sau ? s? k ??n nh?ng li?u cn l?i.  Vui lng u?ng Tylenol sau m?i 6 gi? c?n thi?t ?? gi? n??c cho c?n ?au.  N?u cc tri?u ch?ng thay ??i ho?c tr?m tr?ng h?n, vui lng quay l?i phng c?p c?u.  B?n d?ch ny ???c th?c hi?n b?ng [employer] d?ch v m?i n? l?c ??u ???c th?c hi?n ?? ??m b?o tnh chnh xc v? m?t ng? php.

## 2023-02-21 NOTE — ED Triage Notes (Signed)
Pt c/o HA and CP since last night; sts she had trouble sleeping d/t pain
